# Patient Record
Sex: Male | Born: 1960 | Race: White | Hispanic: No | Marital: Married | Smoking: Never smoker
Health system: Southern US, Community
[De-identification: ages and names within clinical notes are randomized; demographics above are authoritative.]

## PROBLEM LIST (undated history)

## (undated) DIAGNOSIS — R0609 Other forms of dyspnea: Secondary | ICD-10-CM

## (undated) DIAGNOSIS — R002 Palpitations: Principal | ICD-10-CM

## (undated) DIAGNOSIS — I4891 Unspecified atrial fibrillation: Secondary | ICD-10-CM

## (undated) DIAGNOSIS — R7303 Prediabetes: Secondary | ICD-10-CM

## (undated) DIAGNOSIS — E119 Type 2 diabetes mellitus without complications: Secondary | ICD-10-CM

## (undated) DIAGNOSIS — E785 Hyperlipidemia, unspecified: Secondary | ICD-10-CM

## (undated) HISTORY — DX: Palpitations: R00.2

## (undated) HISTORY — DX: Other forms of dyspnea: R06.09

---

## 2015-05-04 ENCOUNTER — Encounter: Payer: Self-pay | Admitting: Emergency Medicine

## 2015-05-04 ENCOUNTER — Emergency Department (INDEPENDENT_AMBULATORY_CARE_PROVIDER_SITE_OTHER)
Admission: EM | Admit: 2015-05-04 | Discharge: 2015-05-04 | Disposition: A | Discharge status: Home / Self Care | Payer: 59 | Source: Home / Self Care

## 2015-05-04 DIAGNOSIS — J069 Acute upper respiratory infection, unspecified: Secondary | ICD-10-CM

## 2015-05-04 MED ORDER — AMOXICILLIN 875 MG PO TABS: ORAL_TABLET | ORAL | Status: DC

## 2015-05-04 NOTE — ED Provider Notes (Signed)
CSN: [id number]     Arrival date & time 05/04/15  [id number] History   None    Chief Complaint  Patient presents with  . URI   HPI URI HISTORY  Daniel CowerLesley is a 54 y.o. male who complains of onset of cold symptoms for 4 days.  Have been using over-the-counter treatment which helps a little bit.  No chills/sweats +  Fever  +  Nasal congestion +  Discolored Post-nasal drainage No sinus pain/pressure No sore throat  +  Cough, nonproductive. Course at times. No wheezing No chest congestion No hemoptysis No shortness of breath No pleuritic pain  No itchy/red eyes No earache  No nausea No vomiting No abdominal pain No diarrhea  No skin rashes +  Fatigue No myalgias No headache   History reviewed. No pertinent past medical history. History reviewed. No pertinent past surgical history. No family history on file. Social History  Substance Use Topics  . Smoking status: Never Smoker   . Smokeless tobacco: None  . Alcohol Use: None    Review of Systems  All other systems reviewed and are negative.   Allergies  Review of patient's allergies indicates no known allergies.  Home Medications   Prior to Admission medications   Medication Sig Start Date End Date Taking? Authorizing Provider  amoxicillin (AMOXIL) 875 MG tablet Take 1 twice a day X 10 days. 05/04/15   Daniel Manesavid Massey, MD   Meds Ordered and Administered this Visit  Medications - No data to display  BP 115/77 mmHg  Pulse 73  Temp(Src) 98.5 F (36.9 C) (Oral)  Ht 6\' 1"  (1.854 m)  Wt 194 lb 4 oz (88.111 kg)  BMI 25.63 kg/m2  SpO2 96% No data found.   Physical Exam  Constitutional: He is oriented to person, place, and time. He appears well-developed and well-nourished. No distress.  HENT:  Head: Normocephalic and atraumatic.  Right Ear: Tympanic membrane, external ear and ear canal normal.  Left Ear: Tympanic membrane, external ear and ear canal normal.  Nose: Mucosal edema and rhinorrhea present. Right  sinus exhibits maxillary sinus tenderness. Left sinus exhibits maxillary sinus tenderness.  Mouth/Throat: Oropharynx is clear and moist. No oral lesions. No oropharyngeal exudate.  Minimal bilateral maxillary sinus tenderness.  Both TMs normal except minimal air-fluid levels. No redness or deformity.  Eyes: Right eye exhibits no discharge. Left eye exhibits no discharge. No scleral icterus.  Neck: Neck supple.  Cardiovascular: Normal rate, regular rhythm and normal heart sounds.   Pulmonary/Chest: Effort normal and breath sounds normal. He has no wheezes. He has no rales.  Lymphadenopathy:    He has cervical adenopathy.  Minimal, shoddy bilateral, minimally tender anterior cervical nodes.  Neurological: He is alert and oriented to person, place, and time.  Skin: Skin is warm and dry.  Nursing note and vitals reviewed.   ED Course  Procedures (including critical care time)  Labs Review Labs Reviewed - No data to display  Imaging Review No results found.    MDM   1. Acute upper respiratory infection    discussed treatment options. I explained this could simply be viral URI, but it's possible that he is starting a secondary bacterial URI. Amoxicillin prescribed, but hold onto this prescription, fill prescription if not improving within 2 days or sooner if worse. Handout for other OTC symptomatic care. Follow-up with your primary care doctor in 7-10 days if not improving, or sooner if symptoms become worse. Precautions discussed. Red flags discussed. Questions invited and answered.  Patient voiced understanding and agreement.     Daniel Manes, MD 05/11/15 [phone]

## 2015-05-04 NOTE — ED Notes (Signed)
Pt c/o head congestion, body aches, bilateral ear discomfort, headache.

## 2015-08-07 ENCOUNTER — Encounter: Payer: Self-pay | Admitting: Emergency Medicine

## 2015-08-07 ENCOUNTER — Emergency Department (INDEPENDENT_AMBULATORY_CARE_PROVIDER_SITE_OTHER)
Admission: EM | Admit: 2015-08-07 | Discharge: 2015-08-07 | Disposition: A | Discharge status: Home / Self Care | Payer: 59 | Source: Home / Self Care | Attending: Family Medicine | Admitting: Family Medicine

## 2015-08-07 ENCOUNTER — Emergency Department (INDEPENDENT_AMBULATORY_CARE_PROVIDER_SITE_OTHER): Payer: 59

## 2015-08-07 DIAGNOSIS — J209 Acute bronchitis, unspecified: Secondary | ICD-10-CM | POA: Diagnosis not present

## 2015-08-07 DIAGNOSIS — R05 Cough: Secondary | ICD-10-CM | POA: Diagnosis not present

## 2015-08-07 MED ORDER — BENZONATATE 200 MG PO CAPS: 200.0000 mg | ORAL_CAPSULE | Freq: Every day | ORAL | Status: DC

## 2015-08-07 MED ORDER — AZITHROMYCIN 250 MG PO TABS
ORAL_TABLET | ORAL | Status: DC
Start: 2015-08-07 — End: 2017-06-09

## 2015-08-07 NOTE — Discharge Instructions (Signed)
Take plain guaifenesin (1200mg  extended release tabs such as Mucinex) twice daily, with plenty of water, for cough and congestion.  May add Pseudoephedrine (30mg , one or two every 4 to 6 hours) for sinus congestion.  Get adequate rest.   May use Afrin nasal spray (or generic oxymetazoline) twice daily for about 5 days and then discontinue.  Also recommend using saline nasal spray several times daily and saline nasal irrigation (AYR is a common brand).  Use Flonase nasal spray each morning after using Afrin nasal spray and saline nasal irrigation. Try warm salt water gargles for sore throat.  Stop all antihistamines for now, and other non-prescription cough/cold preparations. May take Ibuprofen 200mg , 4 tabs every 8 hours with food for chest/sternum discomfort.   Follow-up with family doctor if not improving about one week.

## 2015-08-07 NOTE — ED Provider Notes (Signed)
CSN: [id number]     Arrival date & time 08/07/15  0804 History   First MD Initiated Contact with Patient 08/07/15 0840     Chief Complaint  Patient presents with  . Cough      HPI Comments: Patient complains of six day history of typical cold-like symptoms developing over several days,  including mild sore throat, sinus congestion, headache, fatigue, myalgias, chills, and cough.  He reports that his ears feel clogged.  The history is provided by the patient.    History reviewed. No pertinent past medical history. History reviewed. No pertinent past surgical history. History reviewed. No pertinent family history. Social History  Substance Use Topics  . Smoking status: Never Smoker   . Smokeless tobacco: None  . Alcohol Use: No    Review of Systems + sore throat + cough ? pleuritic pain right chest No wheezing + nasal congestion + post-nasal drainage No sinus pain/pressure No itchy/red eyes ? earache No hemoptysis No SOB No fever, + chills No nausea No vomiting No abdominal pain No diarrhea No urinary symptoms No skin rash + fatigue + myalgias + headache Used OTC meds without relief  Allergies  Review of patient's allergies indicates no known allergies.  Home Medications   Prior to Admission medications   Medication Sig Start Date End Date Taking? Authorizing Provider  azithromycin (ZITHROMAX Z-PAK) 250 MG tablet Take 2 tabs today; then begin one tab once daily for 4 more days. 08/07/15   Lattie Haw, MD  benzonatate (TESSALON) 200 MG capsule Take 1 capsule (200 mg total) by mouth at bedtime. Take as needed for cough 08/07/15   Lattie Haw, MD   Meds Ordered and Administered this Visit  Medications - No data to display  BP 110/73 mmHg  Pulse 89  Temp(Src) 98.2 F (36.8 C) (Oral)  Ht  (1.854 m)  Wt 195 lb (88.451 kg)  BMI 25.73 kg/m2  SpO2 96% No data found.   Physical Exam Nursing notes and Vital Signs reviewed. Appearance:  Patient  appears stated age, and in no acute distress Eyes:  Pupils are equal, round, and reactive to light and accomodation.  Extraocular movement is intact.  Conjunctivae are not inflamed  Ears:  Canals normal.  Right tympanic membrane normal.  Left tympanic membrane is bulging without erythema. Nose:  Congested turbinates.  No sinus tenderness.    Pharynx:  Normal Neck:  Supple.  Tender enlarged posterior/lateral nodes are palpated bilaterally  Lungs:  Clear to auscultation.  Breath sounds are equal.  Moving air well. Heart:  Regular rate and rhythm without murmurs, rubs, or gallops.  Abdomen:  Nontender without masses or hepatosplenomegaly.  Bowel sounds are present.  No CVA or flank tenderness.  Extremities:  No edema.  Skin:  No rash present.   ED Course  Procedures  None  Imaging Review Dg Chest 2 View  08/07/2015  CLINICAL DATA:  URI symptoms with worsening cough for the 6 days, onset of pill fever and chills an 80 feeling today EXAM: CHEST  2 VIEW COMPARISON:  Report of a chest x-ray of December 12, 2012 FINDINGS: The lungs are adequately inflated. There is no focal infiltrate. The interstitial markings are coarse. The heart and pulmonary vascularity are normal. The mediastinum is normal in width. The trachea is midline. The bony thorax exhibits no acute abnormality. IMPRESSION: There is no alveolar pneumonia. Coarse interstitial lung markings may reflect acute or chronic bronchitic change. Electronically Signed   By: Onalee Hua  [place] M.D.   On: 08/07/2015 09:07   Tympanogram:  Normal both ears    MDM   1. Acute bronchitis, unspecified organism    Begin Z-pak for atypical coverage.  Prescription written for Benzonatate [facility](Tessalon) to take at bedtime for night-time cough.  Take plain guaifenesin (1200mg  extended release tabs such as Mucinex) twice daily, with plenty of water, for cough and congestion.  May add Pseudoephedrine (30mg , one or two every 4 to 6 hours) for sinus congestion.  Get adequate  rest.   May use Afrin nasal spray (or generic oxymetazoline) twice daily for about 5 days and then discontinue.  Also recommend using saline nasal spray several times daily and saline nasal irrigation (AYR is a common brand).  Use Flonase nasal spray each morning after using Afrin nasal spray and saline nasal irrigation. Try warm salt water gargles for sore throat.  Stop all antihistamines for now, and other non-prescription cough/cold preparations. May take Ibuprofen 200mg , 4 tabs every 8 hours with food for chest/sternum discomfort.   Follow-up with family doctor if not improving about one week.    Lattie HawStephen A Beese, MD 08/11/15 [phone]

## 2015-08-07 NOTE — ED Notes (Signed)
Cough, congestion, green mucus, body aches, headache, right chest aches x 6 days

## 2017-06-09 ENCOUNTER — Other Ambulatory Visit: Payer: Self-pay

## 2017-06-09 ENCOUNTER — Encounter: Payer: Self-pay | Admitting: Emergency Medicine

## 2017-06-09 ENCOUNTER — Emergency Department
Admission: EM | Admit: 2017-06-09 | Discharge: 2017-06-09 | Disposition: A | Discharge status: Home / Self Care | Payer: 59 | Source: Home / Self Care | Attending: Family Medicine | Admitting: Family Medicine

## 2017-06-09 DIAGNOSIS — J069 Acute upper respiratory infection, unspecified: Secondary | ICD-10-CM | POA: Diagnosis not present

## 2017-06-09 DIAGNOSIS — B9789 Other viral agents as the cause of diseases classified elsewhere: Secondary | ICD-10-CM

## 2017-06-09 HISTORY — DX: Hyperlipidemia, unspecified: E78.5

## 2017-06-09 HISTORY — DX: Prediabetes: R73.03

## 2017-06-09 MED ORDER — AZITHROMYCIN 250 MG PO TABS: ORAL_TABLET | ORAL | 0 refills | Status: DC

## 2017-06-09 NOTE — Discharge Instructions (Addendum)
Take plain guaifenesin (1200mg  extended release tabs such as Mucinex) twice daily, with plenty of water, for cough and congestion.  May add Pseudoephedrine (30mg , one or two every 4 to 6 hours) for sinus congestion.  Get adequate rest.   May use Afrin nasal spray (or generic oxymetazoline) each morning for about 5 days and then discontinue.  Also recommend using saline nasal spray several times daily and saline nasal irrigation (AYR is a common brand).  Use Flonase nasal spray each morning after using Afrin nasal spray and saline nasal irrigation. Try warm salt water gargles for sore throat.  May take Delsym Cough Suppressant at bedtime for nighttime cough.  Stop all antihistamines for now, and other non-prescription cough/cold preparations. May take Ibuprofen 200mg , 4 tabs every 8 hours with food for chest/sternum discomfort.

## 2017-06-09 NOTE — ED Provider Notes (Signed)
[facility]    CSN: [id number] Arrival date & time: 06/09/17  1752     History   Chief Complaint Chief Complaint  Patient presents with  . Cough  . Nasal Congestion  . Generalized Body Aches  . Chills    HPI Daniel Roberts is a 57 y.o. male.   Patient complains of five day history of typical cold-like symptoms developing over several days, including mild sore throat, sinus congestion, headache, fatigue, chills, and cough.  He states that [place] will [place] to [place] next [place].   The history is provided by the patient.    Past Medical History:  Diagnosis Date  . Hyperlipidemia   . Pre-diabetes     There are no active problems to display for this patient.   History reviewed. No pertinent surgical history.     Home Medications    Prior to Admission medications   Medication Sig Start Date End Date Taking? Authorizing Provider  aspirin 81 MG chewable tablet Chew by mouth daily.   Yes [provider]  rosuvastatin (CRESTOR) 20 MG tablet Take 20 mg by mouth daily.   Yes [provider]  azithromycin (ZITHROMAX Z-PAK) 250 MG tablet Take 2 tabs today; then begin one tab once daily for 4 more days. 06/09/17   Lattie HawBeese, Stephen A, MD    Family History History reviewed. No pertinent family history.  Social History Social History   Tobacco Use  . Smoking status: Never Smoker  . Smokeless tobacco: Never Used  Substance Use Topics  . Alcohol use: No  . Drug use: Not on file     Allergies   Bee venom   Review of Systems Review of Systems + sore throat + cough No pleuritic pain No wheezing + nasal congestion + post-nasal drainage No sinus pain/pressure No itchy/red eyes No earache No hemoptysis No SOB No fever, + chills No nausea No vomiting No abdominal pain No diarrhea No urinary symptoms No skin rash + fatigue + myalgias + headache Used OTC meds without relief   Physical Exam Triage Vital Signs ED Triage  Vitals [06/09/17 1827]  Enc Vitals Group     BP 119/71     Pulse Rate 60     Resp 16     Temp 98.7 F (37.1 C)     Temp Source Oral     SpO2 98 %     Weight 195 lb (88.5 kg)     Height 6\' 1"  (1.854 m)     Head Circumference      Peak Flow      Pain Score      Pain Loc      Pain Edu?      Excl. in GC?    No data found.  Updated Vital Signs BP 119/71 (BP Location: Right Arm)   Pulse 60   Temp 98.7 F (37.1 C) (Oral)   Resp 16   Ht 6\' 1"  (1.854 m)   Wt 195 lb (88.5 kg)   SpO2 98%   BMI 25.73 kg/m   Visual Acuity Right Eye Distance:   Left Eye Distance:   Bilateral Distance:    Right Eye Near:   Left Eye Near:    Bilateral Near:     Physical Exam Nursing notes and Vital Signs reviewed. Appearance:  Patient appears stated age, and in no acute distress Eyes:  Pupils are equal, round, and reactive to light and accomodation.  Extraocular movement is intact.  Conjunctivae are  not inflamed  Ears:  Canals normal.  Tympanic membranes normal.  Nose:  Mildly congested turbinates.  No sinus tenderness.  Pharynx:  Normal Neck:  Supple.  Enlarged posterior/lateral nodes are palpated bilaterally, tender to palpation on the left.   Lungs:  Clear to auscultation.  Breath sounds are equal.  Moving air well. Heart:  Regular rate and rhythm without murmurs, rubs, or gallops.  Abdomen:  Nontender without masses or hepatosplenomegaly.  Bowel sounds are present.  No CVA or flank tenderness.  Extremities:  No edema.  Skin:  No rash present.    UC Treatments / Results  Labs (all labs ordered are listed, but only abnormal results are displayed) Labs Reviewed - No data to display  EKG  EKG Interpretation None       Radiology No results found.  Procedures Procedures (including critical care time)  Medications Ordered in UC Medications - No data to display   Initial Impression / Assessment and Plan / UC Course  I have reviewed the triage vital signs and the nursing  notes.  Pertinent labs & imaging results that were available during my care of the patient were reviewed by me and considered in my medical decision making (see chart for details).    Begin Z-pak for atypical coverage. Take plain guaifenesin (1200mg  extended release tabs such as Mucinex) twice daily, with plenty of water, for cough and congestion.  May add Pseudoephedrine (30mg , one or two every 4 to 6 hours) for sinus congestion.  Get adequate rest.   May use Afrin nasal spray (or generic oxymetazoline) each morning for about 5 days and then discontinue.  Also recommend using saline nasal spray several times daily and saline nasal irrigation (AYR is a common brand).  Use Flonase nasal spray each morning after using Afrin nasal spray and saline nasal irrigation. Try warm salt water gargles for sore throat.  May take Delsym Cough Suppressant at bedtime for nighttime cough.  Stop all antihistamines for now, and other non-prescription cough/cold preparations. May take Ibuprofen 200mg , 4 tabs every 8 hours with food for chest/sternum discomfort. Followup with Family Doctor if not improved in 7 to 10 days.    Final Clinical Impressions(s) / UC Diagnoses   Final diagnoses:  Viral URI with cough    ED Discharge Orders        Ordered    azithromycin (ZITHROMAX Z-PAK) 250 MG tablet  Status:  Discontinued     06/09/17 1931    azithromycin (ZITHROMAX Z-PAK) 250 MG tablet     06/09/17 1935          Lattie Haw, MD 06/12/17 2212

## 2017-06-09 NOTE — ED Triage Notes (Signed)
Reports 5 days of congestion, cough which is moving down lower into chest, raw throat, aches, chills. No documented fever. No OTCs today.

## 2018-02-25 ENCOUNTER — Emergency Department
Admission: EM | Admit: 2018-02-25 | Discharge: 2018-02-25 | Disposition: A | Discharge status: Home / Self Care | Payer: 59 | Source: Home / Self Care

## 2018-02-25 ENCOUNTER — Other Ambulatory Visit: Payer: Self-pay

## 2018-02-25 MED ORDER — INFLUENZA VAC SPLIT QUAD 0.5 ML IM SUSY
0.5000 mL | PREFILLED_SYRINGE | Freq: Once | INTRAMUSCULAR | Status: AC
  Administered 2018-02-25: 0.5 mL via INTRAMUSCULAR

## 2018-02-25 NOTE — ED Triage Notes (Signed)
Here for flu shot only

## 2018-07-01 ENCOUNTER — Encounter: Payer: Self-pay | Admitting: Emergency Medicine

## 2018-07-01 ENCOUNTER — Emergency Department
Admission: EM | Admit: 2018-07-01 | Discharge: 2018-07-01 | Disposition: A | Discharge status: Home / Self Care | Payer: 59 | Source: Home / Self Care

## 2018-07-01 ENCOUNTER — Other Ambulatory Visit: Payer: Self-pay

## 2018-07-01 DIAGNOSIS — R9431 Abnormal electrocardiogram [ECG] [EKG]: Secondary | ICD-10-CM

## 2018-07-01 DIAGNOSIS — R031 Nonspecific low blood-pressure reading: Secondary | ICD-10-CM

## 2018-07-01 DIAGNOSIS — I4510 Unspecified right bundle-branch block: Secondary | ICD-10-CM

## 2018-07-01 DIAGNOSIS — R002 Palpitations: Secondary | ICD-10-CM | POA: Diagnosis not present

## 2018-07-01 NOTE — Discharge Instructions (Signed)
°  You have declined EMS transport.  It is recommended that you have your wife drive you to the emergency department for further evaluation and treatment of your palpations (heart racing/skipping beats sensation)

## 2018-07-01 NOTE — ED Triage Notes (Signed)
Patient reports sensation of racing heartbeat at intervals with some irregular beats ; feels this in his left neck area. Denies chest pain and shortness of breath. Did have normal breakfast with 3 cups coffee; regular lunch.

## 2018-07-01 NOTE — ED Provider Notes (Signed)
[facility]    CSN: [id number] Arrival date & time: 07/01/18  1552     History   Chief Complaint Chief Complaint  Patient presents with  . Tachycardia    HPI Daniel Roberts is a 58 y.o. male.   HPI  Daniel Roberts is a 58 y.o. male presenting to UC with c/o sensation of heart racing with intervals of irregular beats.  Sensation radiates into the Left side of his neck.  Denies chest pain or SOB. He did have breakfast and 3 cups of coffee instead of his usual 2 cups.  Sensation started about 2 hours PTA.  Denies nausea or diaphoresis.  No prior hx of heart problems but he did have a prior EKG and stress test that was "normal."     Past Medical History:  Diagnosis Date  . Hyperlipidemia   . Pre-diabetes     There are no active problems to display for this patient.   History reviewed. No pertinent surgical history.     Home Medications    Prior to Admission medications   Medication Sig Start Date End Date Taking? Authorizing Provider  aspirin 81 MG chewable tablet Chew by mouth daily.    [provider]  azithromycin (ZITHROMAX Z-PAK) 250 MG tablet Take 2 tabs today; then begin one tab once daily for 4 more days. 06/09/17   Lattie Haw, MD  rosuvastatin (CRESTOR) 20 MG tablet Take 20 mg by mouth daily.    [provider]    Family History No family history on file.  Social History Social History   Tobacco Use  . Smoking status: Never Smoker  . Smokeless tobacco: Never Used  Substance Use Topics  . Alcohol use: No  . Drug use: Not on file     Allergies   Bee venom   Review of Systems Review of Systems  Constitutional: Negative for chills, diaphoresis, fatigue and fever.  Respiratory: Negative for chest tightness and shortness of breath.   Cardiovascular: Positive for palpitations. Negative for chest pain.  Gastrointestinal: Negative for nausea and vomiting.  Neurological: Negative for dizziness, light-headedness and  headaches.     Physical Exam Triage Vital Signs ED Triage Vitals [07/01/18 1643]  Enc Vitals Group     BP 91/62     Pulse Rate 96     Resp 16     Temp 98.2 F (36.8 C)     Temp Source Oral     SpO2 96 %     Weight      Height      Head Circumference      Peak Flow      Pain Score      Pain Loc      Pain Edu?      Excl. in GC?    Orthostatic VS for the past 24 hrs:  BP- Lying Pulse- Lying BP- Sitting Pulse- Sitting BP- Standing at 0 minutes Pulse- Standing at 0 minutes  07/01/18 1656 109/72 85 97/65 96 105/67 103    Updated Vital Signs BP 91/62 (BP Location: Right Arm) Comment: taken twice  Pulse 96   Temp 98.2 F (36.8 C) (Oral)   Resp 16   Ht 6\' 1"  (1.854 m)   Wt 195 lb (88.5 kg)   SpO2 96%   BMI 25.73 kg/m   Visual Acuity Right Eye Distance:   Left Eye Distance:   Bilateral Distance:    Right Eye Near:   Left Eye Near:  Bilateral Near:     Physical Exam Vitals signs and nursing note reviewed.  Constitutional:      Appearance: Normal appearance. He is well-developed. He is not diaphoretic.  HENT:     Head: Normocephalic and atraumatic.     Nose: Nose normal.     Mouth/Throat:     Mouth: Mucous membranes are moist.  Neck:     Musculoskeletal: Normal range of motion.  Cardiovascular:     Rate and Rhythm: Normal rate and regular rhythm.  Pulmonary:     Effort: Pulmonary effort is normal. No respiratory distress.     Breath sounds: Normal breath sounds. No stridor. No wheezing or rhonchi.  Musculoskeletal: Normal range of motion.  Skin:    General: Skin is warm and dry.  Neurological:     Mental Status: He is alert and oriented to person, place, and time.  Psychiatric:        Behavior: Behavior normal.      UC Treatments / Results  Labs (all labs ordered are listed, but only abnormal results are displayed) Labs Reviewed - No data to display  EKG Date/Time: 07/01/2018   16:31:26 Ventricular Rate: 84 PR Interval: 180 QRS Duration:  104 QT Interval: 366 QTC Calculation: 432 P-R-T axes: 50   -8   43 Text Interpretation: Normal sinus rhythm, possible Left atrial enlargement. Incomplete right bundle branch block. Possible inferior infarct, age undetermined. Abnormal EKG No prior available to compare.   Radiology No results found.  Procedures Procedures (including critical care time)  Medications Ordered in UC Medications - No data to display  Initial Impression / Assessment and Plan / UC Course  I have reviewed the triage vital signs and the nursing notes.  Pertinent labs & imaging results that were available during my care of the patient were reviewed by me and considered in my medical decision making (see chart for details).     Pt's BP on low end for pt at 91/62   Pt usually in 120s/70s EKG abnormal w/o prior to compare Recommend pt have further evaluation in emergency department Wife is accompanying pt and feels comfortable driving pt to the emergency department. They declined EMS transport Pt safe for discharge.   Final Clinical Impressions(s) / UC Diagnoses   Final diagnoses:  Palpitations  Low blood pressure reading  Abnormal EKG  Right bundle branch block     Discharge Instructions      You have declined EMS transport.  It is recommended that you have your wife drive you to the emergency department for further evaluation and treatment of your palpations (heart racing/skipping beats sensation)     ED Prescriptions    None     Controlled Substance Prescriptions [place] Controlled Substance Registry consulted? Not Applicable   Rolla Plate 07/01/18 1733

## 2018-07-04 ENCOUNTER — Telehealth: Payer: Self-pay | Admitting: Emergency Medicine

## 2018-07-04 NOTE — Telephone Encounter (Signed)
Message left on voice mail inquiring about patient's status and encouraging patient to call with questions/concerns.  

## 2018-07-16 NOTE — Progress Notes (Signed)
Subjective:  Primary Physician:  Deland Pretty, MD  Patient ID: Daniel Roberts, male    DOB: 1960/09/13, 58 y.o.   MRN: [id number]  Chief Complaint  Patient presents with  . Palpitations    HPI: Daniel Roberts  is a 58 y.o. male  with Patient was seen in the emergency room on 07/01/2018 when he presented with palpitations and not feeling well through the whole day, and discharged home and advised to follow-up with [place].  Was also found to have low blood pressure on evaluation.  Patient admits to having had 3 cups of coffee several hours prior to presentation.  EKG, labs including CBC, CMP, TSH Normal except for nonfasting elevated blood sugar of 120.  Chest x-ray was normal also.  Again on 07/13/2018, he had similar episode of palpitations but did not go to the emergency room.  No other associated symptoms, no chest pain, no shortness breath, no dizziness or syncope. Again he has had about 2-3 cups of coffee/caffeinated drinks in the day which is fairly normal for him, but the episode happened in the evening. His past medical history significant for new onset diabetes mellitus, planning on starting metformin soon, also has hyperlipidemia but has not been tolerant to statins in the past  Including Livalo leading to severe rash.  Patient has had a routine treadmill exercise stress test in our office in December 2018 for dyspnea.   Past Medical History:  Diagnosis Date  . Dyspnea on exertion 07/18/2018  . Hyperlipidemia   . Palpitations 07/18/2018  . Pre-diabetes     History reviewed. No pertinent surgical history.  Social History   Socioeconomic History  . Marital status: Married    Spouse name: Not on file  . Number of children: 3  . Years of education: Not on file  . Highest education level: Not on file  Occupational History  . Not on file  Social Needs  . Financial resource strain: Not on file  . Food insecurity:    Worry: Not on file    Inability: Not on file  .  Transportation needs:    Medical: Not on file    Non-medical: Not on file  Tobacco Use  . Smoking status: Never Smoker  . Smokeless tobacco: Never Used  Substance and Sexual Activity  . Alcohol use: No  . Drug use: Never  . Sexual activity: Not on file  Lifestyle  . Physical activity:    Days per week: Not on file    Minutes per session: Not on file  . Stress: Not on file  Relationships  . Social connections:    Talks on phone: Not on file    Gets together: Not on file    Attends religious service: Not on file    Active member of club or organization: Not on file    Attends meetings of clubs or organizations: Not on file    Relationship status: Not on file  . Intimate partner violence:    Fear of current or ex partner: Not on file    Emotionally abused: Not on file    Physically abused: Not on file    Forced sexual activity: Not on file  Other Topics Concern  . Not on file  Social History Narrative  . Not on file    Current Outpatient Medications on File Prior to Visit  Medication Sig Dispense Refill  . aspirin 81 MG chewable tablet Chew by mouth daily.    . Cinnamon 500 MG  capsule Take by mouth daily. Unsure of dose    . Multiple Vitamins-Minerals (CENTRUM ADULTS PO) Take by mouth daily as needed.    . Omega-3 Fatty Acids (FISH OIL) 1000 MG CAPS Take by mouth 2 (two) times daily.    Marland Kitchen azithromycin (ZITHROMAX Z-PAK) 250 MG tablet Take 2 tabs today; then begin one tab once daily for 4 more days. (Patient not taking: Reported on 07/18/2018) 6 tablet 0  . rosuvastatin (CRESTOR) 20 MG tablet Take 20 mg by mouth daily.     No current facility-administered medications on file prior to visit.     Review of Systems  Constitutional: Negative for malaise/fatigue and weight loss.  Respiratory: Positive for shortness of breath. Negative for cough and hemoptysis.   Cardiovascular: Positive for palpitations. Negative for chest pain, claudication and leg swelling.  Gastrointestinal:  Negative for abdominal pain, blood in stool, constipation, heartburn and vomiting.  Genitourinary: Negative for dysuria.  Musculoskeletal: Negative for joint pain and myalgias.  Neurological: Negative for dizziness, focal weakness and headaches.  Endo/Heme/Allergies: Does not bruise/bleed easily.  Psychiatric/Behavioral: Negative for depression. The patient is not nervous/anxious.   All other systems reviewed and are negative.      Objective:  Blood pressure 112/71, pulse 64, height '6\' 1"'  (1.854 m), weight 202 lb 9.6 oz (91.9 kg), SpO2 95 %. Body mass index is 26.73 kg/m.  Physical Exam  Constitutional: He appears well-developed and well-nourished. No distress.  HENT:  Head: Atraumatic.  Eyes: Conjunctivae are normal.  Neck: Neck supple. No JVD present. No thyromegaly present.  Cardiovascular: Normal rate, regular rhythm, normal heart sounds and intact distal pulses. Exam reveals no gallop.  No murmur heard. Pulmonary/Chest: Effort normal and breath sounds normal.  Abdominal: Soft. Bowel sounds are normal.  Musculoskeletal: Normal range of motion.        General: No edema.  Neurological: He is alert.  Skin: Skin is warm and dry.  Psychiatric: He has a normal mood and affect.    CARDIAC STUDIES:   Exercise Treadmill Stress Test 04/28/2017: Indication: Dyspnea in a diabetic patient  The patient exercised on Bruce protocol for 13:43 min. Patient achieved 9.06 METS and reached HR 146 bpm, which is 89 % of maximum age-predicted HR.  Stress test terminated due to met target heart rate. Exercise capacity was normal.  HR Response to Exercise: Appropriate  BP Response to Exercise: Normal resting BP- appropriate response Chest Pain: none. Arrhythmias: none ST Changes: With peak exercise there was no ST-T changes of ischemia.  Overall Impression: Normal stress test. Continue primary/secondary prevention.  Assessment & Recommendations:   1. Palpitations  EKG  07/18/2018: Normal sinus rhythm at rate of 71 bpm, normal axis, incomplete right bundle branch block.  No evidence of ischemia, otherwise normal EKG.  2. Dyspnea on exertion   Recommendation:   Patient's symptoms of palpitations may be related to PACs and PVCs.  I do not suspect atrial fibrillation.  At this point watchful waiting is indicated, I have not recommended an event monitor for now.  He will try to avoid drinking excessive coffee and also reduce his caffeine intake.  His symptoms may be related to the extreme stress that he has.   Also he is having some disturbed sleep at night especially keeping himself asleep for greater than 4 hours.  I'll obtain an echocardiogram to exclude the reasons for dyspnea, he had Mild dyspnea on exertion especially climbing flights of stairs.However he just returned in January from the [place] and  he was exercising on a daily basis without any limitations.  I do not think he needs a repeat stress testing.  Watchful waiting is indicated.   He has had severe statin intolerance, Lipitor caused myalgias, Crestor and Livalo caused severe rash.  I'll try simvastatin 20 mg along with Zetia, duration and obtain CMP along with lipid profile testing in 6 weeks and see him back in 2 months for follow-up of echocardiogram and the labs. Now diagnosed with new onset diabetes mellitus,  He thinks he may be starting metformin soon.  Adrian Prows, MD, [facility] 07/18/2018, 2:23 PM [facility]. [place] Pager: [phone] Office: [phone] If no answer Cell [phone]

## 2018-07-18 ENCOUNTER — Ambulatory Visit: Payer: 59 | Admitting: Cardiology

## 2018-07-18 ENCOUNTER — Other Ambulatory Visit: Payer: Self-pay

## 2018-07-18 ENCOUNTER — Encounter: Payer: Self-pay | Admitting: Cardiology

## 2018-07-18 VITALS — BP 112/71 | HR 64 | Ht 73.0 in | Wt 202.6 lb

## 2018-07-18 DIAGNOSIS — E119 Type 2 diabetes mellitus without complications: Secondary | ICD-10-CM

## 2018-07-18 DIAGNOSIS — R002 Palpitations: Secondary | ICD-10-CM | POA: Diagnosis not present

## 2018-07-18 DIAGNOSIS — R0609 Other forms of dyspnea: Secondary | ICD-10-CM | POA: Diagnosis not present

## 2018-07-18 DIAGNOSIS — E7800 Pure hypercholesterolemia, unspecified: Secondary | ICD-10-CM

## 2018-07-18 DIAGNOSIS — R0600 Dyspnea, unspecified: Secondary | ICD-10-CM

## 2018-07-18 HISTORY — DX: Dyspnea, unspecified: R06.00

## 2018-07-18 HISTORY — DX: Other forms of dyspnea: R06.09

## 2018-07-18 HISTORY — DX: Palpitations: R00.2

## 2018-07-18 MED ORDER — EZETIMIBE 10 MG PO TABS: 10.0000 mg | ORAL_TABLET | Freq: Every day | ORAL | 2 refills | Status: DC

## 2018-07-18 MED ORDER — SIMVASTATIN 20 MG PO TABS: 20.0000 mg | ORAL_TABLET | Freq: Every day | ORAL | 2 refills | Status: DC

## 2018-07-27 ENCOUNTER — Other Ambulatory Visit: Payer: 59

## 2018-08-07 ENCOUNTER — Other Ambulatory Visit: Payer: 59

## 2018-09-19 ENCOUNTER — Ambulatory Visit: Payer: 59 | Admitting: Cardiology

## 2019-04-03 ENCOUNTER — Other Ambulatory Visit: Payer: Self-pay

## 2019-04-03 ENCOUNTER — Ambulatory Visit (INDEPENDENT_AMBULATORY_CARE_PROVIDER_SITE_OTHER): Payer: 59

## 2019-04-03 DIAGNOSIS — R0609 Other forms of dyspnea: Secondary | ICD-10-CM

## 2019-04-03 DIAGNOSIS — R002 Palpitations: Secondary | ICD-10-CM

## 2019-04-03 DIAGNOSIS — R0600 Dyspnea, unspecified: Secondary | ICD-10-CM

## 2019-04-08 NOTE — Progress Notes (Signed)
LVM with details.

## 2019-05-23 ENCOUNTER — Other Ambulatory Visit: Payer: Self-pay

## 2019-05-23 ENCOUNTER — Encounter: Payer: Self-pay | Admitting: Cardiology

## 2019-05-23 ENCOUNTER — Ambulatory Visit: Payer: Self-pay | Admitting: Cardiology

## 2019-05-23 VITALS — BP 127/78 | HR 57 | Ht 73.0 in | Wt 197.0 lb

## 2019-05-23 DIAGNOSIS — E7800 Pure hypercholesterolemia, unspecified: Secondary | ICD-10-CM

## 2019-05-23 DIAGNOSIS — I351 Nonrheumatic aortic (valve) insufficiency: Secondary | ICD-10-CM

## 2019-05-23 DIAGNOSIS — R002 Palpitations: Secondary | ICD-10-CM | POA: Diagnosis not present

## 2019-05-23 NOTE — Progress Notes (Signed)
Primary Physician/Referring:  Deland Pretty, MD  Patient ID: Lynnwood Beckford, male    DOB: [birth year], 59 y.o.   MRN: [id number]  Chief Complaint  Patient presents with  . Palpitations   HPI:    Dyllan Hughett  is a 59 y.o. Male patient last seen by me almost a year ago when he initially presented on 07/01/2018 with palpitations to the emergency room, states that he had drank 3 cups of coffee prior to his ED visit. His past medical history significant for new onset diet controlled diabetes mellitus, planning on restarting metformin soon, also has hyperlipidemia but has not been tolerant to statins in the past  Including Livalo leading to severe rash. He is now on Nexlitol.  Still has occasional palpitations. No chest pain or dyspnea and is active.   Past Medical History:  Diagnosis Date  . Dyspnea on exertion 07/18/2018  . Hyperlipidemia   . Palpitations 07/18/2018  . Pre-diabetes    History reviewed. No pertinent surgical history. Social History   Tobacco Use  . Smoking status: Never Smoker  . Smokeless tobacco: Never Used  Substance Use Topics  . Alcohol use: No    ROS  Review of Systems  Constitution: Negative for weight gain.  Cardiovascular: Positive for palpitations. Negative for dyspnea on exertion, leg swelling and syncope.  Respiratory: Negative for hemoptysis.   Endocrine: Negative for cold intolerance.  Hematologic/Lymphatic: Does not bruise/bleed easily.  Gastrointestinal: Negative for hematochezia and melena.  Neurological: Negative for headaches and light-headedness.   Objective  Blood pressure 127/78, pulse (!) 57, height '6\' 1"'  (1.854 m), weight 197 lb (89.4 kg), SpO2 98 %.  Vitals with BMI 05/23/2019 07/18/2018 07/01/2018  Height '6\' 1"'  '6\' 1"'  '6\' 1"'   Weight 197 lbs 202 lbs 10 oz 195 lbs  BMI 26 52.84 13.24  Systolic 401 027 91  Diastolic 78 71 62  Pulse 57 64 96     Physical Exam  Constitutional: He appears well-developed and well-nourished.  Neck: No  thyromegaly present.  Cardiovascular: Normal rate, regular rhythm, normal heart sounds and intact distal pulses. Exam reveals no gallop.  No murmur heard. No leg edema, no JVD.  Pulmonary/Chest: Effort normal and breath sounds normal.  Abdominal: Soft. Bowel sounds are normal.  Musculoskeletal:     Cervical back: Neck supple.  Skin: Skin is warm and dry.   Laboratory examination:   No results for input(s): NA, K, CL, CO2, GLUCOSE, BUN, CREATININE, CALCIUM, GFRNONAA, GFRAA in the last 8760 hours. CrCl cannot be calculated (No successful lab value found.).  No flowsheet data found. No flowsheet data found. Lipid Panel  No results found for: CHOL, TRIG, HDL, CHOLHDL, VLDL, LDLCALC, LDLDIRECT HEMOGLOBIN A1C No results found for: HGBA1C, MPG TSH No results for input(s): TSH in the last 8760 hours.  Medications and allergies   Allergies  Allergen Reactions  . Crestor [Rosuvastatin Calcium]     Severe rash  . Lipitor [Atorvastatin Calcium]     Severe myalgia  . Livalo [Pitavastatin]     Severe rash  . Bee Venom     Current Outpatient Medications  Medication Instructions  . aspirin 81 mg, Oral, Daily  . Cinnamon 500 MG capsule Oral, Daily, Unsure of dose   . Multiple Vitamins-Minerals (CENTRUM ADULTS PO) Oral, Daily PRN  . Nexletol 180 mg, Oral, Daily  . Omega-3 Fatty Acids (FISH OIL) 1000 MG CAPS Oral, 2 times daily    Radiology:  No results found.  Cardiac Studies:   Exercise  Treadmill Stress Test 04/28/2017: Indication: Dyspnea in a diabetic patient  The patient exercised on Bruce protocol for 13:43 min. Patient achieved 9.06 METS and reached HR 146 bpm, which is 89 % of maximum age-predicted HR.  Stress test terminated due to met target heart rate. Exercise capacity was normal.  HR Response to Exercise: Appropriate  BP Response to Exercise: Normal resting BP- appropriate response Chest Pain: none. Arrhythmias: none ST Changes: With peak exercise there  was no ST-T changes of ischemia.  Overall Impression: Normal stress test. Continue primary/secondary prevention.  Echocardiogram 04/03/2019: Left ventricle cavity is normal in size and thickness. Normal LV systolic function with visual EF 50-55%. Normal global wall motion. Normal diastolic filling pattern.  Trileaflet aortic valve. Mild (Grade I) aortic regurgitation. Mild (Grade I) mitral regurgitation. IVC is dilated with respiratory variation. Estimated RA pressure 8 mmHg.  Assessment     ICD-10-CM   1. Palpitations  R00.2 EKG 12-Lead  2. Hypercholesteremia  E78.00   3. Mild aortic regurgitation  I35.1     EKG 05/23/2019: Normal sinus rhythm/sinus bradycardia at rate of 53 bpm, normal axis.  Incomplete right bundle branch block.  No evidence of ischemia, normal EKG. No significant change from  EKG 07/18/2018.   No orders of the defined types were placed in this encounter.   Medications Discontinued During This Encounter  Medication Reason  . ezetimibe (ZETIA) 10 MG tablet Error  . simvastatin (ZOCOR) 20 MG tablet Error     Recommendations:   Yaniel Limbaugh  is a  59 y.o. Male patient last seen by me almost a year ago when he initially presented on 07/01/2018 with palpitations to the emergency room, states that he had drank 3 cups of coffee prior to his ED visit. His past medical history significant for new onset diet controlled diabetes mellitus, planning on restarting metformin soon, also has hyperlipidemia but has not been tolerant to statins in the past  Including Livalo leading to severe rash. He is now on Nexlitol.  Is presently doing well, except for occasional palpitation which is this PACs and PVCs no new symptoms.  I reviewed the results of the echocardiogram again with the patient.  He has mild aortic and mitral regurgitation.  No clinical consequence.  Extensive discussion regarding primary prevention was discussed.  He is now appropriately being tried on new  cholesterol-lowering agent.  Regular exercise was also discussed.  Unless he has no symptoms, I'll see him back in 3 years for follow-up of valvular heart disease.  Adrian Prows, MD, [facility] 05/23/2019, 11:10 AM [facility]. [place] Office: [phone]

## 2020-05-15 ENCOUNTER — Emergency Department: Admit: 2020-05-15 | Discharge status: Absent without leave | Payer: Self-pay

## 2020-05-16 ENCOUNTER — Emergency Department
Admission: RE | Admit: 2020-05-16 | Discharge: 2020-05-16 | Disposition: A | Discharge status: Home / Self Care | Payer: 59 | Source: Ambulatory Visit

## 2020-05-16 ENCOUNTER — Other Ambulatory Visit: Payer: Self-pay

## 2020-05-16 VITALS — BP 153/83 | HR 71 | Temp 98.4°F | Resp 15 | Ht 73.0 in | Wt 195.0 lb

## 2020-05-16 DIAGNOSIS — J988 Other specified respiratory disorders: Secondary | ICD-10-CM

## 2020-05-16 DIAGNOSIS — J3489 Other specified disorders of nose and nasal sinuses: Secondary | ICD-10-CM

## 2020-05-16 DIAGNOSIS — B9789 Other viral agents as the cause of diseases classified elsewhere: Secondary | ICD-10-CM | POA: Diagnosis not present

## 2020-05-16 DIAGNOSIS — Z9189 Other specified personal risk factors, not elsewhere classified: Secondary | ICD-10-CM

## 2020-05-16 MED ORDER — AMOXICILLIN-POT CLAVULANATE 875-125 MG PO TABS: 1.0000 | ORAL_TABLET | Freq: Two times a day (BID) | ORAL | 0 refills | Status: DC

## 2020-05-16 MED ORDER — IPRATROPIUM BROMIDE 0.06 % NA SOLN: 2.0000 | Freq: Four times a day (QID) | NASAL | 1 refills | Status: DC

## 2020-05-16 NOTE — Discharge Instructions (Signed)
  You may take 500mg acetaminophen every 4-6 hours or in combination with ibuprofen 400-600mg every 6-8 hours as needed for pain, inflammation, and fever.  Be sure to well hydrated with clear liquids and get at least 8 hours of sleep at night, preferably more while sick.   Please follow up with family medicine in 1 week if needed.   

## 2020-05-16 NOTE — ED Triage Notes (Signed)
Pt has congestion since Tuesday w/ sore throat  Flew back from [place] 1 week ago  Moderna vaccine in May 2021 - no booster Had a flu vaccine  Rapid test on Wed was negative  Denies fever or chills

## 2020-05-18 NOTE — ED Provider Notes (Signed)
[facility]    CSN: [id number] Arrival date & time: 05/16/20  1439      History   Chief Complaint Chief Complaint  Patient presents with  . Appointment  . Cough  . Nasal Congestion    HPI Daiquan Resnik is a 60 y.o. male.   HPI  Davinci Glotfelty is a 60 y.o. male presenting to UC with c/o 5 days of worsening sinus pain and pressure, associated sore throat and congestion.  She flew back from [place] 1 week ago.  She has had her COVID vaccine in May 2021 but no booster. She had a negative rapid COVID test the day after symptoms started. Denies fever, chills, n/v/d.  Pt reports hx of sinus infections.    Past Medical History:  Diagnosis Date  . Dyspnea on exertion 07/18/2018  . Hyperlipidemia   . Palpitations 07/18/2018  . Pre-diabetes     Patient Active Problem List   Diagnosis Date Noted  . Palpitations 07/18/2018  . Dyspnea on exertion 07/18/2018    History reviewed. No pertinent surgical history.     Home Medications    Prior to Admission medications   Medication Sig Start Date End Date Taking? Authorizing Provider  amoxicillin-clavulanate (AUGMENTIN) 875-125 MG tablet Take 1 tablet by mouth 2 (two) times daily. One po bid x 7 days 05/16/20  Yes Aftin Lye O, PA-C  ipratropium (ATROVENT) 0.06 % nasal spray Place 2 sprays into both nostrils 4 (four) times daily. 05/16/20  Yes Lurene Shadow, PA-C  sildenafil (VIAGRA) 25 MG tablet 1-2 tablet as needed 11/20/19  Yes [provider]  Ascorbic Acid (VITAMIN C) 500 MG CAPS 1 tablet    [provider]  Bempedoic Acid (NEXLETOL) 180 MG TABS Take 180 mg by mouth daily.    [provider]  Cinnamon 500 MG capsule Take by mouth daily. Unsure of dose    [provider]  Multiple Vitamin (MULTI-VITAMIN) tablet 1 tablet    [provider]  Multiple Vitamins-Minerals (CENTRUM ADULTS PO) Take by mouth daily as needed. Patient not taking: Reported on 05/16/2020    [provider]  Omega-3 Fatty Acids (FISH OIL) 1000 MG CAPS Take by mouth 2 (two) times daily.    [provider]    Family History Family History  Problem Relation Age of Onset  . Diabetes Mother   . Stroke Father   . Diabetes Brother   . Healthy Brother     Social History Social History   Tobacco Use  . Smoking status: Never Smoker  . Smokeless tobacco: Never Used  Vaping Use  . Vaping Use: Never used  Substance Use Topics  . Alcohol use: No  . Drug use: Never     Allergies   Crestor [rosuvastatin calcium], Lipitor [atorvastatin calcium], Livalo [pitavastatin], Bee venom, Ezetimibe, and Metformin   Review of Systems Review of Systems  Constitutional: Negative for chills and fever.  HENT: Positive for congestion, sinus pressure and sore throat. Negative for ear pain, trouble swallowing and voice change.   Respiratory: Positive for cough (mild). Negative for shortness of breath.   Cardiovascular: Negative for chest pain and palpitations.  Gastrointestinal: Negative for abdominal pain, diarrhea, nausea and vomiting.  Musculoskeletal: Negative for arthralgias, back pain and myalgias.  Skin: Negative for rash.  Neurological: Positive for headaches (frontal). Negative for dizziness and light-headedness.  All other systems reviewed and are negative.    Physical Exam Triage Vital Signs ED Triage Vitals  Enc Vitals  Group     BP 05/16/20 1607 (!) 153/83     Pulse Rate 05/16/20 1607 71     Resp 05/16/20 1607 15     Temp 05/16/20 1607 98.4 F (36.9 C)     Temp Source 05/16/20 1607 Oral     SpO2 05/16/20 1607 96 %     Weight 05/16/20 1610 195 lb (88.5 kg)     Height 05/16/20 1610 6\' 1"  (1.854 m)     Head Circumference --      Peak Flow --      Pain Score 05/16/20 1609 3     Pain Loc --      Pain Edu? --      Excl. in GC? --    No data found.  Updated Vital Signs BP (!) 153/83 (BP Location: Right Arm)   Pulse 71   Temp 98.4 F (36.9 C) (Oral)    Resp 15   Ht 6\' 1"  (1.854 m)   Wt 195 lb (88.5 kg)   SpO2 96%   BMI 25.73 kg/m   Visual Acuity Right Eye Distance:   Left Eye Distance:   Bilateral Distance:    Right Eye Near:   Left Eye Near:    Bilateral Near:     Physical Exam Vitals and nursing note reviewed.  Constitutional:      General: He is not in acute distress.    Appearance: Normal appearance. He is well-developed and well-nourished. He is not ill-appearing, toxic-appearing or diaphoretic.  HENT:     Head: Normocephalic and atraumatic.     Right Ear: Tympanic membrane and ear canal normal.     Left Ear: Tympanic membrane and ear canal normal.     Nose: Mucosal edema and congestion present.     Right Sinus: Maxillary sinus tenderness and frontal sinus tenderness present.     Left Sinus: Maxillary sinus tenderness and frontal sinus tenderness present.     Comments: Mild sinus tenderness     Mouth/Throat:     Lips: Pink.     Mouth: Mucous membranes are moist.     Pharynx: Oropharynx is clear. Uvula midline.  Eyes:     Extraocular Movements: EOM normal.  Cardiovascular:     Rate and Rhythm: Normal rate and regular rhythm.  Pulmonary:     Effort: Pulmonary effort is normal. No respiratory distress.     Breath sounds: Normal breath sounds. No stridor. No wheezing, rhonchi or rales.  Musculoskeletal:        General: Normal range of motion.     Cervical back: Normal range of motion and neck supple.  Lymphadenopathy:     Cervical: No cervical adenopathy.  Skin:    General: Skin is warm and dry.  Neurological:     Mental Status: He is alert and oriented to person, place, and time.  Psychiatric:        Mood and Affect: Mood and affect normal.        Behavior: Behavior normal.      UC Treatments / Results  Labs (all labs ordered are listed, but only abnormal results are displayed) Labs Reviewed  COVID-19, FLU A+B NAA    EKG   Radiology No results found.  Procedures Procedures (including critical  care time)  Medications Ordered in UC Medications - No data to display  Initial Impression / Assessment and Plan / UC Course  I have reviewed the triage vital signs and the nursing notes.  Pertinent labs & imaging  results that were available during my care of the patient were reviewed by me and considered in my medical decision making (see chart for details).     Hx and exam c/w viral illness COVID/Flu test pending Encouraged symptomatic tx Prescription to hold for augmentin, pt to start if not improving in a few days of symptomatic tx given current duration of symptoms and hx of sinus infections. AVS given  Final Clinical Impressions(s) / UC Diagnoses   Final diagnoses:  Viral respiratory illness  At increased risk of exposure to COVID-19 virus  Sinus pressure     Discharge Instructions      You may take 500mg  acetaminophen every 4-6 hours or in combination with ibuprofen 400-600mg  every 6-8 hours as needed for pain, inflammation, and fever.  Be sure to well hydrated with clear liquids and get at least 8 hours of sleep at night, preferably more while sick.   Please follow up with family medicine in 1 week if needed.     ED Prescriptions    Medication Sig Dispense Auth. Provider   amoxicillin-clavulanate (AUGMENTIN) 875-125 MG tablet Take 1 tablet by mouth 2 (two) times daily. One po bid x 7 days 14 tablet Eloyce Bultman O, PA-C   ipratropium (ATROVENT) 0.06 % nasal spray Place 2 sprays into both nostrils 4 (four) times daily. 15 mL 12-19-1986, PA-C     PDMP not reviewed this encounter.   Lurene Shadow, Lurene Shadow 05/18/20 [phone]

## 2020-05-19 LAB — COVID-19, FLU A+B NAA
Influenza A, NAA: NOT DETECTED
Influenza B, NAA: NOT DETECTED
SARS-CoV-2, NAA: NOT DETECTED

## 2021-06-05 ENCOUNTER — Encounter: Payer: Self-pay | Admitting: Family Medicine

## 2021-06-05 ENCOUNTER — Other Ambulatory Visit: Payer: Self-pay

## 2021-06-05 ENCOUNTER — Emergency Department
Admission: EM | Admit: 2021-06-05 | Discharge: 2021-06-05 | Disposition: A | Discharge status: Home / Self Care | Payer: 59 | Source: Home / Self Care

## 2021-06-05 DIAGNOSIS — N399 Disorder of urinary system, unspecified: Secondary | ICD-10-CM

## 2021-06-05 LAB — POCT URINALYSIS DIP (MANUAL ENTRY)
Bilirubin, UA: NEGATIVE
Blood, UA: NEGATIVE
Glucose, UA: NEGATIVE mg/dL
Ketones, POC UA: NEGATIVE mg/dL
Leukocytes, UA: NEGATIVE
Nitrite, UA: NEGATIVE
Protein Ur, POC: NEGATIVE mg/dL
Spec Grav, UA: 1.01 (ref 1.010–1.025)
Urobilinogen, UA: 0.2 E.U./dL
pH, UA: 6 (ref 5.0–8.0)

## 2021-06-05 NOTE — Discharge Instructions (Addendum)
Patient UA was completely unremarkable this morning.  Advised/encouraged patient if irregular voiding pattern and mild bilateral flank pain continues please follow-up with either PCP or urology for further evaluation.

## 2021-06-05 NOTE — ED Triage Notes (Signed)
Pt presents to Urgent Care with c/o fatigue and bilateral flank pain x 2 days. Reports intermittent urinary problems x 6 months, foul odor noted recently.

## 2021-06-05 NOTE — ED Provider Notes (Signed)
[facility]    CSN: [id number] Arrival date & time: 06/05/21  1020      History   Chief Complaint Chief Complaint  Patient presents with   Flank Pain   Fatigue    HPI Daniel Roberts is a 61 y.o. male.   HPI 61 year old male presents with fatigue and bilateral flank pain for 2 days.  Reports intermittent urinary problems for 6 months, foul odor noted recently.  Patient denies inability to void, although complains of some urinary retention intermittently.  Past Medical History:  Diagnosis Date   Dyspnea on exertion 07/18/2018   Hyperlipidemia    Palpitations 07/18/2018   Pre-diabetes     Patient Active Problem List   Diagnosis Date Noted   Palpitations 07/18/2018   Dyspnea on exertion 07/18/2018    History reviewed. No pertinent surgical history.     Home Medications    Prior to Admission medications   Medication Sig Start Date End Date Taking? Authorizing Provider  Cetirizine HCl (ZYRTEC ALLERGY) 10 MG CAPS Take by mouth.   Yes [provider]  ibuprofen (ADVIL) 400 MG tablet Take 400 mg by mouth every 6 (six) hours as needed.   Yes [provider]  Plant Sterols and Stanols (CHOLESTOFF) 450 MG TABS Take by mouth.   Yes [provider]  amoxicillin-clavulanate (AUGMENTIN) 875-125 MG tablet Take 1 tablet by mouth 2 (two) times daily. One po bid x 7 days 05/16/20   Lurene Shadow, PA-C  Ascorbic Acid (VITAMIN C) 500 MG CAPS 1 tablet    [provider]  Bempedoic Acid (NEXLETOL) 180 MG TABS Take 180 mg by mouth daily.    [provider]  Cinnamon 500 MG capsule Take by mouth daily. Unsure of dose    [provider]  ipratropium (ATROVENT) 0.06 % nasal spray Place 2 sprays into both nostrils 4 (four) times daily. 05/16/20   Lurene Shadow, PA-C  Multiple Vitamin (MULTI-VITAMIN) tablet 1 tablet    [provider]  Multiple Vitamins-Minerals (CENTRUM ADULTS PO) Take by mouth daily as needed. Patient  not taking: Reported on 05/16/2020    [provider]  Omega-3 Fatty Acids (FISH OIL) 1000 MG CAPS Take by mouth 2 (two) times daily.    [provider]  sildenafil (VIAGRA) 25 MG tablet 1-2 tablet as needed 11/20/19   [provider]    Family History Family History  Problem Relation Age of Onset   Diabetes Mother    Stroke Father    Diabetes Brother    Healthy Brother     Social History Social History   Tobacco Use   Smoking status: Never   Smokeless tobacco: Never  Vaping Use   Vaping Use: Never used  Substance Use Topics   Alcohol use: No   Drug use: Never     Allergies   Crestor [rosuvastatin calcium], Lipitor [atorvastatin calcium], Livalo [pitavastatin], Bee venom, Ezetimibe, and Metformin   Review of Systems Review of Systems  Constitutional:  Positive for fatigue.  Genitourinary:  Positive for flank pain.       Reports intermittent urinary problems for 6 months    Physical Exam Triage Vital Signs ED Triage Vitals  Enc Vitals Group     BP      Pulse      Resp      Temp      Temp src      SpO2      Weight  Height      Head Circumference      Peak Flow      Pain Score      Pain Loc      Pain Edu?      Excl. in GC?    No data found.  Updated Vital Signs BP (!) 152/81 (BP Location: Left Arm)    Pulse 67    Temp 98 F (36.7 C) (Oral)    Resp 20    Ht 6\' 1"  (1.854 m)    Wt 192 lb (87.1 kg)    SpO2 97%    BMI 25.33 kg/m      Physical Exam Vitals and nursing note reviewed.  Constitutional:      Appearance: Normal appearance. He is obese.  HENT:     Mouth/Throat:     Mouth: Mucous membranes are moist.     Pharynx: Oropharynx is clear.  Eyes:     Extraocular Movements: Extraocular movements intact.     Conjunctiva/sclera: Conjunctivae normal.     Pupils: Pupils are equal, round, and reactive to light.  Cardiovascular:     Rate and Rhythm: Normal rate and regular rhythm.     Pulses: Normal pulses.     Heart  sounds: Normal heart sounds.  Pulmonary:     Effort: Pulmonary effort is normal.     Breath sounds: Normal breath sounds.  Abdominal:     Tenderness: There is no right CVA tenderness or left CVA tenderness.  Musculoskeletal:     Cervical back: Normal range of motion and neck supple.  Skin:    General: Skin is warm and dry.  Neurological:     General: No focal deficit present.     Mental Status: He is alert and oriented to person, place, and time.     UC Treatments / Results  Labs (all labs ordered are listed, but only abnormal results are displayed) Labs Reviewed  POCT URINALYSIS DIP (MANUAL ENTRY)    EKG   Radiology No results found.  Procedures Procedures (including critical care time)  Medications Ordered in UC Medications - No data to display  Initial Impression / Assessment and Plan / UC Course  I have reviewed the triage vital signs and the nursing notes.  Pertinent labs & imaging results that were available during my care of the patient were reviewed by me and considered in my medical decision making (see chart for details).     MDM: 1.  Urinary disorder-Advised patient UA was completely unremarkable this morning. Patient UA was completely unremarkable this morning.  Advised/encouraged patient if irregular voiding pattern and mild bilateral flank pain continues please follow-up with either PCP or urology for further evaluation.  Patient discharged home, hemodynamically stable. Final Clinical Impressions(s) / UC Diagnoses   Final diagnoses:  Urinary disorder     Discharge Instructions      Patient UA was completely unremarkable this morning.  Advised/encouraged patient if irregular voiding pattern and mild bilateral flank pain continues please follow-up with either PCP or urology for further evaluation.     ED Prescriptions   None    PDMP not reviewed this encounter.   , FNP 06/05/21 1122

## 2021-09-17 ENCOUNTER — Other Ambulatory Visit: Payer: Self-pay | Admitting: Internal Medicine

## 2021-09-17 DIAGNOSIS — E782 Mixed hyperlipidemia: Secondary | ICD-10-CM

## 2021-09-22 ENCOUNTER — Ambulatory Visit
Admission: RE | Admit: 2021-09-22 | Discharge: 2021-09-22 | Disposition: A | Discharge status: Home / Self Care | Payer: 59 | Source: Ambulatory Visit | Attending: Internal Medicine | Admitting: Internal Medicine

## 2021-09-22 DIAGNOSIS — E782 Mixed hyperlipidemia: Secondary | ICD-10-CM

## 2021-11-08 ENCOUNTER — Emergency Department (INDEPENDENT_AMBULATORY_CARE_PROVIDER_SITE_OTHER)
Admission: EM | Admit: 2021-11-08 | Discharge: 2021-11-08 | Disposition: A | Discharge status: Home / Self Care | Payer: 59 | Source: Home / Self Care | Attending: Family Medicine | Admitting: Family Medicine

## 2021-11-08 DIAGNOSIS — S39012A Strain of muscle, fascia and tendon of lower back, initial encounter: Secondary | ICD-10-CM

## 2021-11-08 LAB — POCT URINALYSIS DIP (MANUAL ENTRY)
Bilirubin, UA: NEGATIVE
Blood, UA: NEGATIVE
Glucose, UA: NEGATIVE mg/dL
Ketones, POC UA: NEGATIVE mg/dL
Leukocytes, UA: NEGATIVE
Nitrite, UA: NEGATIVE
Protein Ur, POC: NEGATIVE mg/dL
Spec Grav, UA: 1.01 (ref 1.010–1.025)
Urobilinogen, UA: 0.2 E.U./dL
pH, UA: 6 (ref 5.0–8.0)

## 2021-11-08 MED ORDER — METHYLPREDNISOLONE 4 MG PO TBPK: ORAL_TABLET | ORAL | 0 refills | Status: DC

## 2021-11-08 MED ORDER — TIZANIDINE HCL 4 MG PO TABS: 4.0000 mg | ORAL_TABLET | Freq: Four times a day (QID) | ORAL | 0 refills | Status: DC | PRN

## 2021-11-08 NOTE — Discharge Instructions (Signed)
Take the Medrol Dosepak as directed.  This is a steroid anti-inflammatory to help with nerve inflammation.  Take all of day 1 today Take tizanidine as needed as muscle relaxer.  This is helpful at bedtime. Continue with ice or heat to area Activities as tolerated

## 2021-11-08 NOTE — ED Triage Notes (Signed)
Pt c/o back pain since Sunday morning. Radiates down LT side. No hx of sciatica or kidney stones. Pain 8/10 Icing and taking tylenol/advil prn.

## 2021-11-08 NOTE — ED Provider Notes (Signed)
[facility]    CSN: [id number] Arrival date & time: 11/08/21  1257      History   Chief Complaint Chief Complaint  Patient presents with   Back Pain    HPI Elhadj Girton is a 61 y.o. male.   HPI  Pleasant 61 year old gentleman.  States that he has had back pain over the years that was easily treated with an occasional chiropractic visit, Advil, or Tylenol.  Currently has back pain that is more severe than previous.  It is in the left low back, points to his left SI area.  The pain radiates to the left testicle.  Pain with movement.  Better with rest.  No accident, injury, overuse.  He states that it came on after he had gone on a hike.  He states that he hikes most weekends and this is not a change in his usual exertion or habits No numbness or weakness No bowel or bladder complaint No history of kidney stones p No hematuria  Past Medical History:  Diagnosis Date   Dyspnea on exertion 07/18/2018   Hyperlipidemia    Palpitations 07/18/2018   Pre-diabetes     Patient Active Problem List   Diagnosis Date Noted   Palpitations 07/18/2018   Dyspnea on exertion 07/18/2018    History reviewed. No pertinent surgical history.     Home Medications    Prior to Admission medications   Medication Sig Start Date End Date Taking? Authorizing Provider  methylPREDNISolone (MEDROL DOSEPAK) 4 MG TBPK tablet tad 11/08/21  Yes Eustace Moore, MD  tiZANidine (ZANAFLEX) 4 MG tablet Take 1-2 tablets (4-8 mg total) by mouth every 6 (six) hours as needed for muscle spasms. 11/08/21  Yes Eustace Moore, MD  Ascorbic Acid (VITAMIN C) 500 MG CAPS 1 tablet    [provider]  Bempedoic Acid (NEXLETOL) 180 MG TABS Take 180 mg by mouth daily.    [provider]  Cetirizine HCl (ZYRTEC ALLERGY) 10 MG CAPS Take by mouth.    [provider]  Cinnamon 500 MG capsule Take by mouth daily. Unsure of dose    [provider]  ibuprofen (ADVIL) 400 MG  tablet Take 400 mg by mouth every 6 (six) hours as needed.    [provider]  ipratropium (ATROVENT) 0.06 % nasal spray Place 2 sprays into both nostrils 4 (four) times daily. 05/16/20   Lurene Shadow, PA-C  Multiple Vitamin (MULTI-VITAMIN) tablet 1 tablet    [provider]  Multiple Vitamins-Minerals (CENTRUM ADULTS PO) Take by mouth daily as needed. Patient not taking: Reported on 05/16/2020    [provider]  Omega-3 Fatty Acids (FISH OIL) 1000 MG CAPS Take by mouth 2 (two) times daily.    [provider]  Plant Sterols and Stanols (CHOLESTOFF) 450 MG TABS Take by mouth.    [provider]  sildenafil (VIAGRA) 25 MG tablet 1-2 tablet as needed 11/20/19   [provider]    Family History Family History  Problem Relation Age of Onset   Diabetes Mother    Stroke Father    Diabetes Brother    Healthy Brother     Social History Social History   Tobacco Use   Smoking status: Never   Smokeless tobacco: Never  Vaping Use   Vaping Use: Never used  Substance Use Topics   Alcohol use: No   Drug use: Never     Allergies   Crestor [rosuvastatin calcium], Lipitor [atorvastatin calcium],  Livalo [pitavastatin], Bee venom, Ezetimibe, and Metformin   Review of Systems Review of Systems See HPI  Physical Exam Triage Vital Signs ED Triage Vitals  Enc Vitals Group     BP 11/08/21 1302 129/79     Pulse Rate 11/08/21 1302 (!) 51     Resp 11/08/21 1302 17     Temp 11/08/21 1302 98.8 F (37.1 C)     Temp Source 11/08/21 1302 Oral     SpO2 11/08/21 1302 98 %     Weight --      Height --      Head Circumference --      Peak Flow --      Pain Score 11/08/21 1306 8     Pain Loc --      Pain Edu? --      Excl. in GC? --    No data found.  Updated Vital Signs BP 129/79 (BP Location: Right Arm)   Pulse (!) 51   Temp 98.8 F (37.1 C) (Oral)   Resp 17   SpO2 98%       Physical Exam Constitutional:      General: He is  not in acute distress.    Appearance: He is well-developed and normal weight. He is not ill-appearing.     Comments: Guarded movements  HENT:     Head: Normocephalic and atraumatic.  Eyes:     Conjunctiva/sclera: Conjunctivae normal.     Pupils: Pupils are equal, round, and reactive to light.  Cardiovascular:     Rate and Rhythm: Normal rate.  Pulmonary:     Effort: Pulmonary effort is normal. No respiratory distress.  Abdominal:     General: There is no distension.     Palpations: Abdomen is soft.  Musculoskeletal:        General: Normal range of motion.     Cervical back: Normal range of motion.       Back:  Skin:    General: Skin is warm and dry.  Neurological:     General: No focal deficit present.     Mental Status: He is alert.     Sensory: No sensory deficit.     Motor: No weakness.     Coordination: Coordination normal.     Gait: Gait normal.     Deep Tendon Reflexes: Reflexes normal.  Psychiatric:        Mood and Affect: Mood normal.        Behavior: Behavior normal.      UC Treatments / Results  Labs (all labs ordered are listed, but only abnormal results are displayed) Labs Reviewed  POCT URINALYSIS DIP (MANUAL ENTRY)   Dip urinalysis is negative EKG   Radiology No results found.  Procedures Procedures (including critical care time)  Medications Ordered in UC Medications - No data to display  Initial Impression / Assessment and Plan / UC Course  I have reviewed the triage vital signs and the nursing notes.  Pertinent labs & imaging results that were available during my care of the patient were reviewed by me and considered in my medical decision making (see chart for details).     Final Clinical Impressions(s) / UC Diagnoses   Final diagnoses:  Strain of lumbar region, initial encounter     Discharge Instructions      Take the Medrol Dosepak as directed.  This is a steroid anti-inflammatory to help with nerve inflammation.  Take all  of day 1 today Take tizanidine  as needed as muscle relaxer.  This is helpful at bedtime. Continue with ice or heat to area Activities as tolerated    ED Prescriptions     Medication Sig Dispense Auth. Provider   methylPREDNISolone (MEDROL DOSEPAK) 4 MG TBPK tablet tad 21 tablet Eustace Moore, MD   tiZANidine (ZANAFLEX) 4 MG tablet Take 1-2 tablets (4-8 mg total) by mouth every 6 (six) hours as needed for muscle spasms. 21 tablet Eustace Moore, MD      PDMP not reviewed this encounter.   Eustace Moore, MD 11/08/21 1352

## 2021-12-23 ENCOUNTER — Other Ambulatory Visit (HOSPITAL_COMMUNITY): Payer: Self-pay

## 2021-12-23 DIAGNOSIS — E782 Mixed hyperlipidemia: Secondary | ICD-10-CM | POA: Insufficient documentation

## 2021-12-28 ENCOUNTER — Telehealth: Payer: Self-pay | Admitting: Pharmacy Technician

## 2021-12-28 NOTE — Telephone Encounter (Addendum)
Auth Submission: DENIED DENIED DUE TO NOT MEDICALLY NECESSARY. Payer: [employer] Medication & CPT/J Code(s) submitted: Leqvio Parke Simmers) [id number] Route of submission (phone, fax, portal): phone  Phone # [phone] Fax # [phone] Auth type: Buy/Bill Units/visits requested:  Reference number: Approval from:  to  at [facility] as   [facility]: [phone]  Dr. Merri Brunette RN; Kennon Rounds Left v/m with Victorino Dike @ [facility] awaiting call back. Left 2nd v/m 01/07/22 @ 9a (left message w/[facility] receptionist)  Lisette Abu APPEAL DEPT: [phone]  Co-pay card: approved Pat id: [id number] Card id: [id number] PHONE: [phone] FAX EOB: [phone]

## 2022-01-03 ENCOUNTER — Telehealth: Payer: Self-pay | Admitting: Pharmacy Technician

## 2022-01-03 NOTE — Telephone Encounter (Signed)
error 

## 2022-03-10 ENCOUNTER — Ambulatory Visit
Admission: EM | Admit: 2022-03-10 | Discharge: 2022-03-10 | Disposition: A | Discharge status: Home / Self Care | Payer: 59 | Attending: Family Medicine | Admitting: Family Medicine

## 2022-03-10 DIAGNOSIS — J069 Acute upper respiratory infection, unspecified: Secondary | ICD-10-CM | POA: Diagnosis not present

## 2022-03-10 HISTORY — DX: Type 2 diabetes mellitus without complications: E11.9

## 2022-03-10 LAB — POC SARS CORONAVIRUS 2 AG -  ED: SARS Coronavirus 2 Ag: NEGATIVE

## 2022-03-10 NOTE — ED Triage Notes (Signed)
Pt presents to Urgent Care with c/o sore throat, L ear pain, and "soreness in lungs" x 4 days w/ only a mild cough. Has not done COVID test.

## 2022-03-10 NOTE — Discharge Instructions (Addendum)
Drink lots of fluids May take over-the-counter cough and cold medicine as needed Tylenol or ibuprofen for pain COVID test is negative Your doctor if not improving by next week

## 2022-03-10 NOTE — ED Provider Notes (Signed)
[name]    CSN: [id number] Arrival date & time: 03/10/22  1403      History   Chief Complaint Chief Complaint  Patient presents with   Sore Throat   Otalgia   Chest Pain    "Lungs are sore"    HPI Daniel Roberts is a 61 y.o. male.   HPI Patient's been sick for about 4 days.  He has had some sore throat, runny nose, and left ear pain.  Note last couple days he has developed a's "soreness in my lungs".  No shortness of breath.  Mild cough.  No sputum production.  No sweats chills or fever.  No body aches.  Somewhat fatigued.  No known exposure to COVID  Past Medical History:  Diagnosis Date   Diabetes mellitus without complication ([place])    Dyspnea on exertion 07/18/2018   Hyperlipidemia    Palpitations 07/18/2018    Patient Active Problem List   Diagnosis Date Noted   Mixed hyperlipidemia 12/23/2021   Palpitations 07/18/2018   Dyspnea on exertion 07/18/2018    History reviewed. No pertinent surgical history.     Home Medications    Prior to Admission medications   Medication Sig Start Date End Date Taking? Authorizing Provider  Ascorbic Acid (VITAMIN C) 500 MG CAPS 1 tablet    [provider]  Cetirizine HCl (ZYRTEC ALLERGY) 10 MG CAPS Take by mouth.    [provider]  Cinnamon 500 MG capsule Take by mouth daily. Unsure of dose    [provider]  ibuprofen (ADVIL) 400 MG tablet Take 400 mg by mouth every 6 (six) hours as needed.    [provider]  Multiple Vitamin (MULTI-VITAMIN) tablet 1 tablet    [provider]  Omega-3 Fatty Acids (FISH OIL) 1000 MG CAPS Take by mouth 2 (two) times daily.    [provider]  Plant Sterols and Stanols (CHOLESTOFF) 450 MG TABS Take by mouth.    [provider]  sildenafil (VIAGRA) 25 MG tablet 1-2 tablet as needed 11/20/19   [provider]    Family History Family History  Problem Relation Age of Onset   Diabetes Mother    Atrial  fibrillation Mother    Stroke Father    Diabetes Brother    Healthy Brother     Social History Social History   Tobacco Use   Smoking status: Never   Smokeless tobacco: Never  Vaping Use   Vaping Use: Never used  Substance Use Topics   Alcohol use: No   Drug use: Never     Allergies   Crestor [rosuvastatin calcium], Lipitor [atorvastatin calcium], Livalo [pitavastatin], Bee venom, Ezetimibe, and Metformin   Review of Systems Review of Systems  See HPI Physical Exam Triage Vital Signs ED Triage Vitals  Enc Vitals Group     BP 03/10/22 1424 117/75     Pulse Rate 03/10/22 1424 (!) 56     Resp 03/10/22 1424 20     Temp 03/10/22 1424 97.9 F (36.6 C)     Temp Source 03/10/22 1424 Oral     SpO2 03/10/22 1424 97 %     Weight 03/10/22 1420 192 lb (87.1 kg)     Height 03/10/22 1420 6\' 1"  (1.854 m)     Head Circumference --      Peak Flow --      Pain Score 03/10/22 1419 6     Pain Loc --      Pain  Edu? --      Excl. in [place]? --    No data found.  Updated Vital Signs BP 117/75 (BP Location: Left Arm)   Pulse (!) 56   Temp 97.9 F (36.6 C) (Oral)   Resp 20   Ht 6\' 1"  (1.854 m)   Wt 87.1 kg   SpO2 97%   BMI 25.33 kg/m      Physical Exam Constitutional:      General: He is not in acute distress.    Appearance: He is well-developed and normal weight.  HENT:     Head: Normocephalic and atraumatic.     Right Ear: Tympanic membrane and ear canal normal.     Left Ear: Tympanic membrane and ear canal normal.     Nose: Congestion and rhinorrhea present.     Mouth/Throat:     Pharynx: No posterior oropharyngeal erythema.  Eyes:     Conjunctiva/sclera: Conjunctivae normal.     Pupils: Pupils are equal, round, and reactive to light.  Cardiovascular:     Rate and Rhythm: Normal rate and regular rhythm.     Heart sounds: Normal heart sounds.  Pulmonary:     Effort: Pulmonary effort is normal. No respiratory distress.     Breath sounds: Normal breath sounds.   Abdominal:     General: There is no distension.     Palpations: Abdomen is soft.  Musculoskeletal:        General: Normal range of motion.     Cervical back: Normal range of motion.  Skin:    General: Skin is warm and dry.  Neurological:     Mental Status: He is alert.      UC Treatments / Results  Labs (all labs ordered are listed, but only abnormal results are displayed) Labs Reviewed - No data to display  EKG   Radiology No results found.  Procedures Procedures (including critical care time)  Medications Ordered in UC Medications - No data to display  Initial Impression / Assessment and Plan / UC Course  I have reviewed the triage vital signs and the nursing notes.  Pertinent labs & imaging results that were available during my care of the patient were reviewed by me and considered in my medical decision making (see chart for details).     COVID test is negative.  Exam is benign.  Recommend symptomatic care.  Return if not improving by next week Final Clinical Impressions(s) / UC Diagnoses   Final diagnoses:  Viral upper respiratory tract infection     Discharge Instructions      Drink lots of fluids May take over-the-counter cough and cold medicine as needed Tylenol or ibuprofen for pain COVID test is negative Your doctor if not improving by next week     ED Prescriptions   None    PDMP not reviewed this encounter.   Raylene Everts, MD 03/10/22 [phone]

## 2022-03-14 ENCOUNTER — Encounter: Payer: Self-pay | Admitting: Pulmonary Disease

## 2022-10-12 ENCOUNTER — Encounter: Payer: Self-pay | Admitting: Cardiology

## 2022-10-12 ENCOUNTER — Other Ambulatory Visit: Payer: Self-pay | Admitting: Cardiology

## 2022-10-12 ENCOUNTER — Ambulatory Visit: Payer: 59 | Admitting: Cardiology

## 2022-10-12 VITALS — BP 107/72 | HR 56 | Resp 16 | Ht 73.0 in | Wt 196.0 lb

## 2022-10-12 DIAGNOSIS — I480 Paroxysmal atrial fibrillation: Secondary | ICD-10-CM

## 2022-10-12 DIAGNOSIS — I2510 Atherosclerotic heart disease of native coronary artery without angina pectoris: Secondary | ICD-10-CM | POA: Insufficient documentation

## 2022-10-12 DIAGNOSIS — R002 Palpitations: Secondary | ICD-10-CM

## 2022-10-12 MED ORDER — RIVAROXABAN 20 MG PO TABS: 20.0000 mg | ORAL_TABLET | Freq: Every day | ORAL | 2 refills | Status: DC

## 2022-10-12 MED ORDER — MULTAQ 400 MG PO TABS: 400.0000 mg | ORAL_TABLET | Freq: Two times a day (BID) | ORAL | 2 refills | Status: DC

## 2022-10-12 NOTE — Progress Notes (Addendum)
Patient referred by Merri Brunette, MD for palpitations  Subjective:   Daniel Roberts, male    DOB: Aug 29, 1960, 62 y.o.   MRN: [id number]  Chief Complaint  Patient presents with   Palpitations   New Patient (Initial Visit)    HPI  62 y.o. Caucasian male with hyperlipidemia, type 2 DM, elevated coronary calcium score, referred for possible Afib  Patient was last seen by Dr. Jacinto Halim in 2020 with palpitations. Workup at that time was unremarkable. He has had recent increase in palpitations that led to [facility] monitor through PCP Dr. Renne Crigler. Zio monitor personally reviewed and interpreted by me-showed paroxysmal Afib/flutter (<1% burden), see details below. He denies chest pain, has occasional dyspnea during fib episodes. He denies excessive caffeine and alcohol use. He snores, denies apneic episodes. He has never had sleep study.  Separately. He had elevated CA score in 09/2021. He did not tolerate rosuvastatin and another statin (not atorvastatin) due to myalgias. He was previously on Metformin for diabetes, currently not on any medication. He has an upcoming appt with PCP to discuss the same.     Past Medical History:  Diagnosis Date   Diabetes mellitus without complication (HCC)    Dyspnea on exertion 07/18/2018   Hyperlipidemia    Palpitations 07/18/2018     History reviewed. No pertinent surgical history.   Social History   Tobacco Use  Smoking Status Never  Smokeless Tobacco Never    Social History   Substance and Sexual Activity  Alcohol Use No     Family History  Problem Relation Age of Onset   Diabetes Mother    Atrial fibrillation Mother    Stroke Father    Diabetes Brother    Healthy Brother       Current Outpatient Medications:    Ascorbic Acid (VITAMIN C) 500 MG CAPS, 1 tablet, Disp: , Rfl:    Cetirizine HCl (ZYRTEC ALLERGY) 10 MG CAPS, Take by mouth., Disp: , Rfl:    Cinnamon 500 MG capsule, Take by mouth daily. Unsure of dose, Disp: , Rfl:     ibuprofen (ADVIL) 400 MG tablet, Take 400 mg by mouth every 6 (six) hours as needed., Disp: , Rfl:    Multiple Vitamin (MULTI-VITAMIN) tablet, 1 tablet, Disp: , Rfl:    Omega-3 Fatty Acids (FISH OIL) 1000 MG CAPS, Take by mouth 2 (two) times daily., Disp: , Rfl:    Plant Sterols and Stanols (CHOLESTOFF) 450 MG TABS, Take by mouth., Disp: , Rfl:    sildenafil (VIAGRA) 25 MG tablet, 1-2 tablet as needed, Disp: , Rfl:    Turmeric (QC TUMERIC COMPLEX PO), Take by mouth., Disp: , Rfl:    Cardiovascular and other pertinent studies:  Reviewed external labs and tests, independently interpreted  EKG 10/12/2022: Sinus bradycardia 52 bpm  Incomplete RBBB  Mobile cardiac telemetry 13 days 09/21/2022 - 10/05/2022: Dominant rhythm: Sinus. HR 42-145 bpm. Avg HR 63 bpm, in sinus rhythm. Atrial fibrillation/flutter <1% burden, average ventricular rate of 139 bpm.  Longest episode lasted 1 hour 6-minute, average rate 142 bpm.  A-fib/flutter was detected within +/- 45 seconds of symptomatic patient events.   Stated episodes of SVT are also likely to be A-fib/flutter. <1% isolated SVE, couplet/triplets. 1 episode of VT, at 169 bpm for 6 beats. <1% isolated VE, couplets. No high grade AV block, sinus pause >3sec noted. 28 patient triggered events, correlated with both sinus rhythm and Afib  CT cardiac scoring 09/23/2021: LM; 0 LAD: 288 LCx: 0 RCA:  1   Total score: 289 MESA database percentile: 80    Echocardiogram 04/03/2019: Left ventricle cavity is normal in size and thickness. Normal LV systolic function with visual EF 50-55%. Normal global wall motion. Normal diastolic filling pattern.  Trileaflet aortic valve. Mild (Grade I) aortic regurgitation. Mild (Grade I) mitral regurgitation. IVC is dilated with respiratory variation. Estimated RA pressure 8 mmHg.  Exercise Treadmill Stress Test 04/28/2017: Indication: Dyspnea in a diabetic patient   The patient exercised on Bruce protocol for 13:43  min. Patient achieved  9.06 METS and reached HR  146 bpm, which is  89 % of maximum age-predicted HR.   Stress test terminated due to met target heart rate. Exercise capacity was normal.   HR Response to Exercise: Appropriate   BP Response to Exercise: Normal resting BP- appropriate response Chest Pain: none. Arrhythmias: none ST Changes: With peak exercise there was no ST-T changes of ischemia.   Overall Impression: Normal stress test. Continue primary/secondary prevention.   Recent labs: 09/16/2022: Glucose 163, BUN/Cr 16/0.89. EGFR 96. Na/K 140/4.9. Rest of the CMP normal H/H 15/46. MCV 88. Platelets 326 HbA1C 7.0% Chol 180, TG 76, HDL 37, LDL 129  09/10/2022: H/H 15/44. MCV 85. Platelets 299   Review of Systems  Cardiovascular:  Positive for dyspnea on exertion (During Afib episodes) and palpitations. Negative for chest pain, leg swelling and syncope.        Vitals:   10/12/22 0957  BP: 107/72  Pulse: (!) 56  Resp: 16  SpO2: 96%     Body mass index is 25.86 kg/m. Filed Weights   10/12/22 0957  Weight: 196 lb (88.9 kg)     Objective:   Physical Exam Vitals and nursing note reviewed.  Constitutional:      General: He is not in acute distress. Neck:     Vascular: No JVD.  Cardiovascular:     Rate and Rhythm: Normal rate and regular rhythm.     Heart sounds: Normal heart sounds. No murmur heard. Pulmonary:     Effort: Pulmonary effort is normal.     Breath sounds: Normal breath sounds. No wheezing or rales.  Musculoskeletal:     Right lower leg: No edema.     Left lower leg: No edema.         Visit diagnoses:   ICD-10-CM   1. Palpitations  R00.2 EKG 12-Lead    2. PAF (paroxysmal atrial fibrillation) (HCC)  I48.0 PCV ECHOCARDIOGRAM COMPLETE    Ambulatory referral to Sleep Studies    CANCELED: Ambulatory referral to Sleep Studies    3. Coronary artery disease involving native coronary artery of native heart without angina pectoris  I25.10 PCV  ECHOCARDIOGRAM COMPLETE    PCV MYOCARDIAL PERFUSION WO LEXISCAN       Orders Placed This Encounter  Procedures   Ambulatory referral to Sleep Studies   PCV MYOCARDIAL PERFUSION WO LEXISCAN   EKG 12-Lead   PCV ECHOCARDIOGRAM COMPLETE     Medication changes this visit: Medications Discontinued During This Encounter  Medication Reason   ibuprofen (ADVIL) 400 MG tablet Discontinued by provider    Meds ordered this encounter  Medications   rivaroxaban (XARELTO) 20 MG TABS tablet    Sig: Take 1 tablet (20 mg total) by mouth daily with supper.    Dispense:  30 tablet    Refill:  2   dronedarone (MULTAQ) 400 MG tablet    Sig: Take 1 tablet (400 mg total) by mouth 2 (two) times  daily with a meal.    Dispense:  60 tablet    Refill:  2     Assessment & Recommendations:   62 y.o. Caucasian male with hyperlipidemia, type 2 DM, elevated coronary calcium score, referred for possible Afib  Paroxysmal Afib: <1% burden, but symptomatic. Discussed rate vs rhythm control. Given that resting heart rate is controlled, and he is symptomatic from Afib, rhythm control is more attractive. Discussed AAD vs referral for ablation. We mutually decided to pursue AAD with Multaq 400 mg bid at this time., CHA2DS2VASc score 2, annual stroke risk 2.2% (CAD, DM) Recommend Xarelto 20 mg daily. Avoid regular use of NSAIDS.  Elevated CA score, CAD without angina: 288 in LAD> Recommend exercise nuclear stress test, echocardiogram for risk stratification. Recommend Lipitor. He wants to hold off until next visit. Will address again at next visit.  Further recommendations after above testing.   Thank you for referring the patient to [place]. Please feel free to contact with any questions.   Elder Negus, MD Pager: [phone] Office: [phone]

## 2022-10-14 NOTE — Telephone Encounter (Signed)
From pharmacy

## 2022-10-19 ENCOUNTER — Telehealth: Payer: Self-pay

## 2022-10-19 NOTE — Telephone Encounter (Signed)
Patient is having a allergic reaction to either Multaq or xarelto. He has not taken multaq this morning. Symptoms are irritation in corners of mouth,Itchy neck,rash forearms and back of knees, eyes feel itchy and  irritated, NO shortness of breath.

## 2022-10-19 NOTE — Telephone Encounter (Signed)
Patient informed. He will pick up Eliquis 5mg .

## 2022-10-19 NOTE — Telephone Encounter (Signed)
He does have history of allergic reactions to medications. Hard ot tell if it is multaq or Xarelto. I would stop both. Okay to use over the counter benadryl 25 mg PO. We may need to try alternate blood thinner. Could pick up some Eliquis 5 mg samples from office to start next week after rash has resolved.  Thanks MJP

## 2022-10-20 ENCOUNTER — Encounter: Payer: Self-pay | Admitting: Pulmonary Disease

## 2022-10-21 ENCOUNTER — Ambulatory Visit: Payer: 59 | Admitting: Cardiology

## 2022-10-31 ENCOUNTER — Other Ambulatory Visit: Payer: 59

## 2022-11-04 ENCOUNTER — Other Ambulatory Visit: Payer: 59

## 2022-11-14 ENCOUNTER — Ambulatory Visit: Payer: 59

## 2022-11-14 DIAGNOSIS — I2510 Atherosclerotic heart disease of native coronary artery without angina pectoris: Secondary | ICD-10-CM

## 2022-11-16 ENCOUNTER — Ambulatory Visit: Payer: 59

## 2022-11-16 ENCOUNTER — Encounter: Payer: Self-pay | Admitting: Pulmonary Disease

## 2022-11-16 DIAGNOSIS — I2510 Atherosclerotic heart disease of native coronary artery without angina pectoris: Secondary | ICD-10-CM

## 2022-11-16 DIAGNOSIS — I480 Paroxysmal atrial fibrillation: Secondary | ICD-10-CM

## 2022-11-17 ENCOUNTER — Ambulatory Visit: Payer: 59 | Admitting: Cardiology

## 2022-11-21 NOTE — Progress Notes (Signed)
Patient referred by Merri Brunette, MD for palpitations  Subjective:   Daniel Roberts, male    DOB: Mar 10, 1961, 62 y.o.   MRN: [id number]  Chief Complaint  Patient presents with   Palpitations   Follow-up    4 week    HPI  62 y.o. Caucasian male with hyperlipidemia, type 2 DM, elevated coronary calcium score, PAF  Patient started on Xarelto and Multaq but had some skin rash. He since stopped Multaq and changed Xarelto to Eliquis. He has not had any recurrent rash, has had some mild itching. He wants to wait another 10 days before starting Multaq. Reviewed recent test results with the patient, details below. Sleep study is pending. He has lost 10 lbs weight intentionally.   Initial consultation visit 10/2022: Patient was last seen by Dr. Jacinto Halim in 2020 with palpitations. Workup at that time was unremarkable. He has had recent increase in palpitations that led to [facility] monitor through PCP Dr. Renne Crigler. Zio monitor personally reviewed and interpreted by me-showed paroxysmal Afib/flutter (<1% burden), see details below. He denies chest pain, has occasional dyspnea during fib episodes. He denies excessive caffeine and alcohol use. He snores, denies apneic episodes. He has never had sleep study.  Separately. He had elevated CA score in 09/2021. He did not tolerate rosuvastatin and another statin (not atorvastatin) due to myalgias. He was previously on Metformin for diabetes, currently not on any medication. He has an upcoming appt with PCP to discuss the same.     Current Outpatient Medications:    apixaban (ELIQUIS) 5 MG TABS tablet, Take 5 mg by mouth 2 (two) times daily., Disp: , Rfl:    Ascorbic Acid (VITAMIN C) 500 MG CAPS, 1 tablet, Disp: , Rfl:    Cetirizine HCl (ZYRTEC ALLERGY) 10 MG CAPS, Take by mouth., Disp: , Rfl:    Cinnamon 500 MG capsule, Take by mouth daily. Unsure of dose, Disp: , Rfl:    Multiple Vitamin (MULTI-VITAMIN) tablet, 1 tablet, Disp: , Rfl:    Omega-3 Fatty Acids  (FISH OIL) 1000 MG CAPS, Take by mouth 2 (two) times daily., Disp: , Rfl:    Plant Sterols and Stanols (CHOLESTOFF) 450 MG TABS, Take by mouth., Disp: , Rfl:    sildenafil (VIAGRA) 25 MG tablet, 1-2 tablet as needed, Disp: , Rfl:    Turmeric (QC TUMERIC COMPLEX PO), Take by mouth., Disp: , Rfl:    Cardiovascular and other pertinent studies:  Reviewed external labs and tests, independently interpreted  EKG 10/12/2022: Sinus bradycardia 52 bpm  Incomplete RBBB  Echocardiogram 11/16/2022: Left ventricle cavity is normal in size. Normal left ventricular wall thickness. Normal global wall motion. Normal LV systolic function with EF 64%. Normal diastolic filling pattern. Trileaflet aortic valve. Mild to moderate, posteriorly directed aortic regurgitation. Mild MR noted on previous study in 2020, not noted on this study.   Exercise nuclear stress test 11/14/2022: Myocardial perfusion is normal. Overall LV systolic function is normal without regional wall motion abnormalities. Stress LV EF: 51%.  Normal ECG stress. The patient exercised for 9 minutes and 59 seconds of a Bruce protocol, achieving approximately 11.8 METs & 89% MPHR. No chest pain. Stress terminated due to fatigue. The blood pressure response was normal. No previous exam available for comparison. Low risk.     Mobile cardiac telemetry 13 days 09/21/2022 - 10/05/2022: Dominant rhythm: Sinus. HR 42-145 bpm. Avg HR 63 bpm, in sinus rhythm. Atrial fibrillation/flutter <1% burden, average ventricular rate of 139 bpm.  Longest episode  lasted 1 hour 6-minute, average rate 142 bpm.  A-fib/flutter was detected within +/- 45 seconds of symptomatic patient events.   Stated episodes of SVT are also likely to be A-fib/flutter. <1% isolated SVE, couplet/triplets. 1 episode of VT, at 169 bpm for 6 beats. <1% isolated VE, couplets. No high grade AV block, sinus pause >3sec noted. 28 patient triggered events, correlated with both sinus rhythm and  Afib  CT cardiac scoring 09/23/2021: LM; 0 LAD: 288 LCx: 0 RCA: 1   Total score: 289 MESA database percentile: 80    Echocardiogram 04/03/2019: Left ventricle cavity is normal in size and thickness. Normal LV systolic function with visual EF 50-55%. Normal global wall motion. Normal diastolic filling pattern.  Trileaflet aortic valve. Mild (Grade I) aortic regurgitation. Mild (Grade I) mitral regurgitation. IVC is dilated with respiratory variation. Estimated RA pressure 8 mmHg.  Exercise Treadmill Stress Test 04/28/2017: Indication: Dyspnea in a diabetic patient   The patient exercised on Bruce protocol for 13:43 min. Patient achieved  9.06 METS and reached HR  146 bpm, which is  89 % of maximum age-predicted HR.   Stress test terminated due to met target heart rate. Exercise capacity was normal.   HR Response to Exercise: Appropriate   BP Response to Exercise: Normal resting BP- appropriate response Chest Pain: none. Arrhythmias: none ST Changes: With peak exercise there was no ST-T changes of ischemia.   Overall Impression: Normal stress test. Continue primary/secondary prevention.   Recent labs: 09/16/2022: Glucose 163, BUN/Cr 16/0.89. EGFR 96. Na/K 140/4.9. Rest of the CMP normal H/H 15/46. MCV 88. Platelets 326 HbA1C 7.0% Chol 180, TG 76, HDL 37, LDL 129  09/10/2022: H/H 15/44. MCV 85. Platelets 299   Review of Systems  Cardiovascular:  Positive for dyspnea on exertion (During Afib episodes) and palpitations. Negative for chest pain, leg swelling and syncope.        Vitals:   11/25/22 0934  BP: 126/73  Pulse: (!) 58  Resp: 16  SpO2: 97%      Body mass index is 24.8 kg/m. Filed Weights   11/25/22 0934  Weight: 188 lb (85.3 kg)      Objective:   Physical Exam Vitals and nursing note reviewed.  Constitutional:      General: He is not in acute distress. Neck:     Vascular: No JVD.  Cardiovascular:     Rate and Rhythm: Normal rate and regular  rhythm.     Heart sounds: Normal heart sounds. No murmur heard. Pulmonary:     Effort: Pulmonary effort is normal.     Breath sounds: Normal breath sounds. No wheezing or rales.  Musculoskeletal:     Right lower leg: No edema.     Left lower leg: No edema.         Visit diagnoses:   ICD-10-CM   1. PAF (paroxysmal atrial fibrillation) (HCC)  I48.0     2. Coronary artery disease involving native coronary artery of native heart without angina pectoris  I25.10     3. Mixed hyperlipidemia  E78.2         Medication changes this visit: Meds ordered this encounter  Medications   apixaban (ELIQUIS) 5 MG TABS tablet    Sig: Take 1 tablet (5 mg total) by mouth 2 (two) times daily.    Dispense:  180 tablet    Refill:  3   dronedarone (MULTAQ) 400 MG tablet    Sig: Take 1 tablet (400 mg total) by mouth  2 (two) times daily with a meal.    Dispense:  180 tablet    Refill:  3     Assessment & Recommendations:    62 y.o. Caucasian male with hyperlipidemia, type 2 DM, elevated coronary calcium score, PAF  Paroxysmal Afib: <1% burden, but symptomatic. He will start Multaq 400 mg bid soon. If recurrent Afib on Multaq, would recommend EP referral and consideration for ablation. CHA2DS2VASc score 2, annual stroke risk 2.2% (CAD, DM) Continue Eliquis 5 mg bid.  Elevated CA score, CAD without angina: 288 in LAD. No ischemia on stress testing (10/2022). Recommended Lipitor. He wants to hold off at this time, Will discuss again at next visit.  F/u in 3 months    Elder Negus, MD Pager: [phone] Office: [phone]

## 2022-11-25 ENCOUNTER — Encounter: Payer: Self-pay | Admitting: Cardiology

## 2022-11-25 ENCOUNTER — Ambulatory Visit: Payer: 59 | Admitting: Cardiology

## 2022-11-25 VITALS — BP 126/73 | HR 58 | Resp 16 | Ht 73.0 in | Wt 188.0 lb

## 2022-11-25 DIAGNOSIS — E782 Mixed hyperlipidemia: Secondary | ICD-10-CM

## 2022-11-25 DIAGNOSIS — I480 Paroxysmal atrial fibrillation: Secondary | ICD-10-CM

## 2022-11-25 DIAGNOSIS — I2510 Atherosclerotic heart disease of native coronary artery without angina pectoris: Secondary | ICD-10-CM

## 2022-11-25 MED ORDER — APIXABAN 5 MG PO TABS: 5.0000 mg | ORAL_TABLET | Freq: Two times a day (BID) | ORAL | 3 refills | Status: DC

## 2022-11-25 MED ORDER — MULTAQ 400 MG PO TABS: 400.0000 mg | ORAL_TABLET | Freq: Two times a day (BID) | ORAL | 3 refills | Status: DC

## 2023-01-05 ENCOUNTER — Ambulatory Visit
Admission: EM | Admit: 2023-01-05 | Discharge: 2023-01-05 | Disposition: A | Discharge status: Home / Self Care | Payer: 59 | Attending: Family Medicine | Admitting: Family Medicine

## 2023-01-05 ENCOUNTER — Encounter: Payer: Self-pay | Admitting: Pulmonary Disease

## 2023-01-05 DIAGNOSIS — K122 Cellulitis and abscess of mouth: Secondary | ICD-10-CM | POA: Diagnosis not present

## 2023-01-05 DIAGNOSIS — J029 Acute pharyngitis, unspecified: Secondary | ICD-10-CM

## 2023-01-05 HISTORY — DX: Unspecified atrial fibrillation: I48.91

## 2023-01-05 MED ORDER — PREDNISONE 20 MG PO TABS: ORAL_TABLET | ORAL | 0 refills | Status: DC

## 2023-01-05 MED ORDER — AMOXICILLIN 875 MG PO TABS: 875.0000 mg | ORAL_TABLET | Freq: Two times a day (BID) | ORAL | 0 refills | Status: AC

## 2023-01-05 NOTE — ED Triage Notes (Signed)
Pt reports sore throat x 2 days. Losing voice today, and reports tightness in throat.  Denies fever, body aches.  States he hasn't felt great but that's not abnormal since he started a new med (Xarelto).

## 2023-01-05 NOTE — Discharge Instructions (Addendum)
Advised patient to take medication as directed with food to completion.  Advised patient to take Prednisone with first dose of Amoxicillin for the next 5 of 7 days.  Encouraged to increase daily water intake to 64 ounces per day while taking these medications.  Advised if symptoms worsen and/or unresolved please follow-up with PCP or here for further evaluation.

## 2023-01-05 NOTE — ED Provider Notes (Signed)
[facility]    CSN: [id number] Arrival date & time: 01/05/23  1909      History   Chief Complaint Chief Complaint  Patient presents with   Sore Throat    HPI Daniel Roberts is a 62 y.o. male.   HPI 62 year old male presents with sore throat for 2 days.  Advised has not felt normal since starting new medicine Xarelto for atrial fibrillation (patient denies any current bleeding).  PMH significant for obesity and HLD.   Past Medical History:  Diagnosis Date   Atrial fibrillation (HCC)    Diabetes mellitus without complication (HCC)    Dyspnea on exertion 07/18/2018   Hyperlipidemia    Palpitations 07/18/2018    Patient Active Problem List   Diagnosis Date Noted   PAF (paroxysmal atrial fibrillation) (HCC) 10/12/2022   Coronary artery disease involving native coronary artery of native heart without angina pectoris 10/12/2022   Mixed hyperlipidemia 12/23/2021   Palpitations 07/18/2018   Dyspnea on exertion 07/18/2018    History reviewed. No pertinent surgical history.     Home Medications    Prior to Admission medications   Medication Sig Start Date End Date Taking? Authorizing Provider  amoxicillin (AMOXIL) 875 MG tablet Take 1 tablet (875 mg total) by mouth 2 (two) times daily for 7 days. 01/05/23 01/12/23 Yes Trevor Iha, FNP  apixaban (ELIQUIS) 5 MG TABS tablet Take 1 tablet (5 mg total) by mouth 2 (two) times daily. 11/25/22  Yes Patwardhan, Anabel Bene, MD  dronedarone (MULTAQ) 400 MG tablet Take 1 tablet (400 mg total) by mouth 2 (two) times daily with a meal. 11/25/22  Yes Patwardhan, Manish J, MD  predniSONE (DELTASONE) 20 MG tablet Take 3 tabs PO daily x 5 days. 01/05/23  Yes Trevor Iha, FNP  Ascorbic Acid (VITAMIN C) 500 MG CAPS 1 tablet    [provider]  Cetirizine HCl (ZYRTEC ALLERGY) 10 MG CAPS Take by mouth.    [provider]  Cholecalciferol (VITAMIN D-3) 25 MCG (1000 UT) CAPS Take by mouth.    [provider]   Cinnamon 500 MG capsule Take by mouth daily. Unsure of dose    [provider]  Multiple Vitamin (MULTI-VITAMIN) tablet 1 tablet    [provider]  Omega-3 Fatty Acids (FISH OIL) 1000 MG CAPS Take by mouth 2 (two) times daily. Patient not taking: Reported on 11/25/2022    [provider]  Plant Sterols and Stanols (CHOLESTOFF) 450 MG TABS Take by mouth.    [provider]  psyllium (REGULOID) 0.52 g capsule Take 0.52 g by mouth daily.    [provider]  sildenafil (VIAGRA) 25 MG tablet 1-2 tablet as needed 11/20/19   [provider]  Zinc 50 MG TABS Take by mouth.    [provider]    Family History Family History  Problem Relation Age of Onset   Diabetes Mother    Atrial fibrillation Mother    Stroke Father    Diabetes Brother    Healthy Brother     Social History Social History   Tobacco Use   Smoking status: Never   Smokeless tobacco: Never  Vaping Use   Vaping status: Never Used  Substance Use Topics   Alcohol use: No   Drug use: Never     Allergies   Crestor [rosuvastatin calcium], Lipitor [atorvastatin calcium], Livalo [pitavastatin], Bee venom, Ezetimibe, and Metformin   Review of Systems Review of Systems  All other systems reviewed and are  negative.    Physical Exam Triage Vital Signs ED Triage Vitals [01/05/23 1925]  Encounter Vitals Group     BP      Systolic BP Percentile      Diastolic BP Percentile      Pulse      Resp      Temp      Temp src      SpO2      Weight      Height      Head Circumference      Peak Flow      Pain Score 5     Pain Loc      Pain Education      Exclude from Growth Chart    No data found.  Updated Vital Signs There were no vitals taken for this visit.   Physical Exam Vitals reviewed.  Constitutional:      General: He is not in acute distress.    Appearance: He is well-developed and normal weight. He is not ill-appearing.  HENT:     Head:  Normocephalic and atraumatic.     Right Ear: Tympanic membrane and ear canal normal.     Left Ear: Tympanic membrane and ear canal normal.     Mouth/Throat:     Mouth: Mucous membranes are moist.     Pharynx: Oropharynx is clear. Uvula midline. Posterior oropharyngeal erythema and uvula swelling present.  Eyes:     Conjunctiva/sclera: Conjunctivae normal.     Pupils: Pupils are equal, round, and reactive to light.  Cardiovascular:     Rate and Rhythm: Normal rate and regular rhythm.     Heart sounds: Normal heart sounds.  Pulmonary:     Effort: Pulmonary effort is normal.     Breath sounds: Normal breath sounds. No wheezing, rhonchi or rales.  Musculoskeletal:     Cervical back: Normal range of motion and neck supple.  Skin:    General: Skin is warm and dry.  Neurological:     General: No focal deficit present.     Mental Status: He is alert and oriented to person, place, and time.      UC Treatments / Results  Labs (all labs ordered are listed, but only abnormal results are displayed) Labs Reviewed - No data to display  EKG   Radiology No results found.  Procedures Procedures (including critical care time)  Medications Ordered in UC Medications - No data to display  Initial Impression / Assessment and Plan / UC Course  I have reviewed the triage vital signs and the nursing notes.  Pertinent labs & imaging results that were available during my care of the patient were reviewed by me and considered in my medical decision making (see chart for details).     MDM: 1.  Uvulitis-Rx'd Amoxicillin 875 mg twice daily x 7 days; 2.  Sore throat-Rx'd prednisone 60 mg daily x 5 days. Advised patient to take medication as directed with food to completion.  Advised patient to take Prednisone with first dose of Amoxicillin for the next 5 of 7 days.  Encouraged to increase daily water intake to 64 ounces per day while taking these medications.  Advised if symptoms worsen and/or  unresolved please follow-up with PCP or here for further evaluation.  Patient discharged home, hemodynamically stable please Final Clinical Impressions(s) / UC Diagnoses   Final diagnoses:  Uvulitis  Sore throat     Discharge Instructions      Advised patient to take  medication as directed with food to completion.  Advised patient to take Prednisone with first dose of Amoxicillin for the next 5 of 7 days.  Encouraged to increase daily water intake to 64 ounces per day while taking these medications.  Advised if symptoms worsen and/or unresolved please follow-up with PCP or here for further evaluation.     ED Prescriptions     Medication Sig Dispense Auth. Provider   predniSONE (DELTASONE) 20 MG tablet Take 3 tabs PO daily x 5 days. 15 tablet Trevor Iha, FNP   amoxicillin (AMOXIL) 875 MG tablet Take 1 tablet (875 mg total) by mouth 2 (two) times daily for 7 days. 14 tablet Trevor Iha, FNP      PDMP not reviewed this encounter.   Trevor Iha, FNP 01/05/23 2007

## 2023-01-19 IMAGING — CT CT CARDIAC CORONARY ARTERY CALCIUM SCORE
1 of 3 series · 9 of 20 positions shown, 12 images · non-contrast
Comparison: None Available.

CLINICAL DATA: 61-year-old Caucasian male

EXAM:
CT CARDIAC CORONARY ARTERY CALCIUM SCORE
TECHNIQUE: Non-contrast imaging through the heart was performed using
prospective ECG gating. Image post processing was performed on an
independent workstation, allowing for quantitative analysis of the
heart and coronary arteries. Note that this exam targets the heart
and the chest was not imaged in its entirety.

[Series 3: thin sfov · axial · 0.39mm/px · z∈[-93,+16]mm · 9 of 137 slices shown, 12 images]
[im 14/137  vessel]
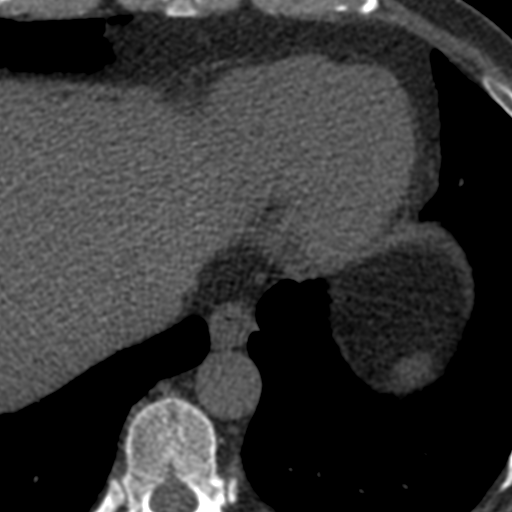
[im 14/137  lung]
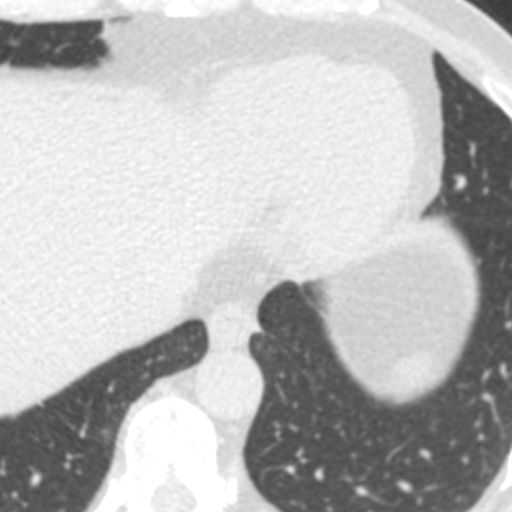
[im 28/137  vessel]
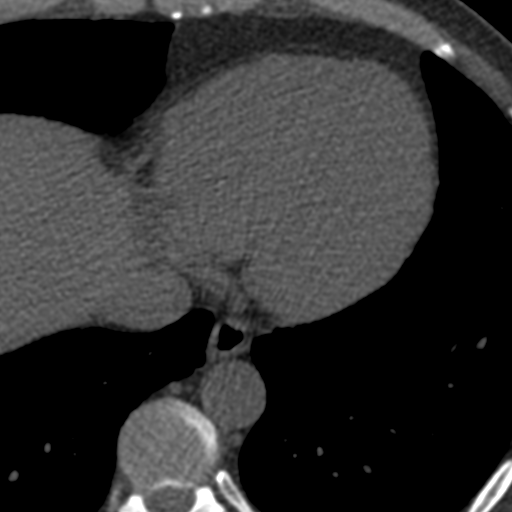
[im 41/137  vessel]
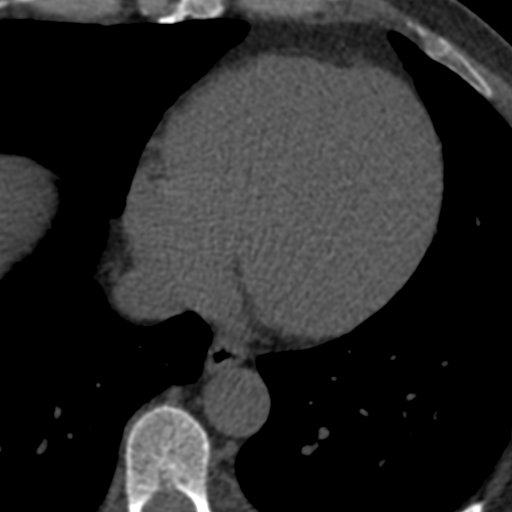
[im 55/137  vessel]
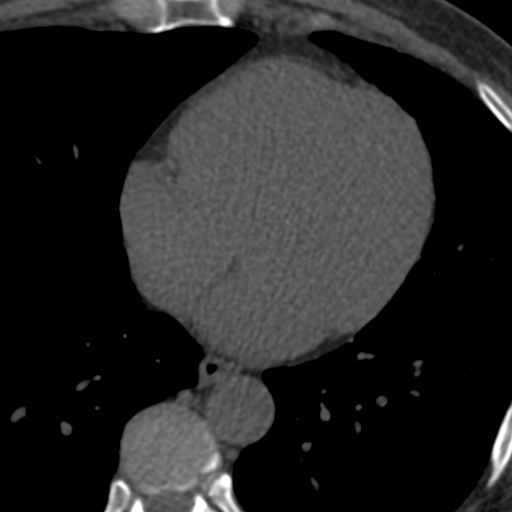
[im 69/137  vessel]
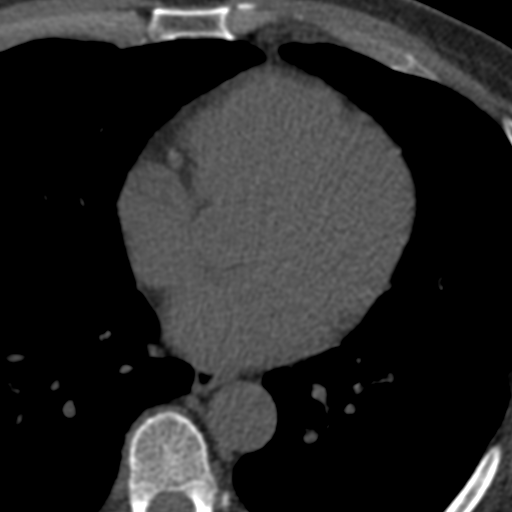
[im 69/137  lung]
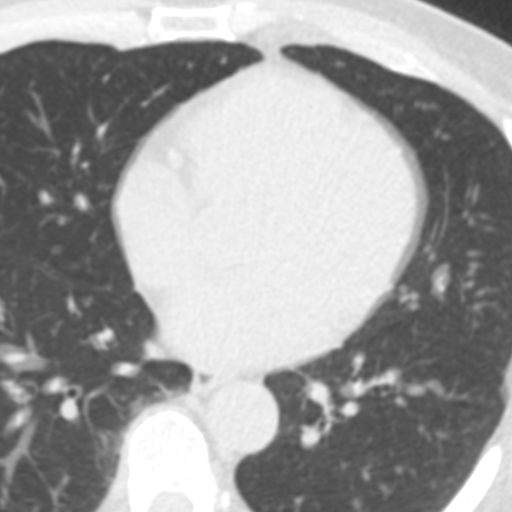
[im 82/137  vessel]
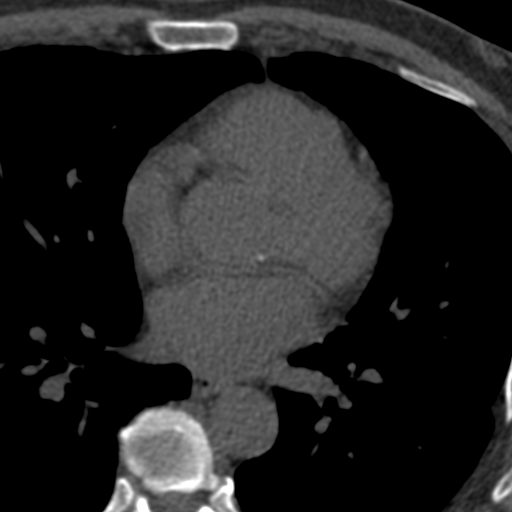
[im 96/137  vessel]
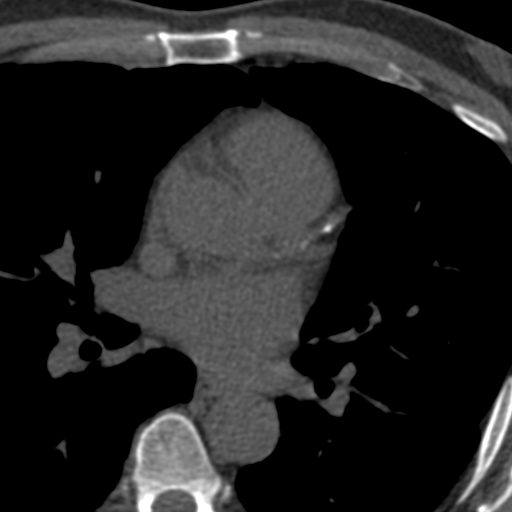
[im 109/137  vessel]
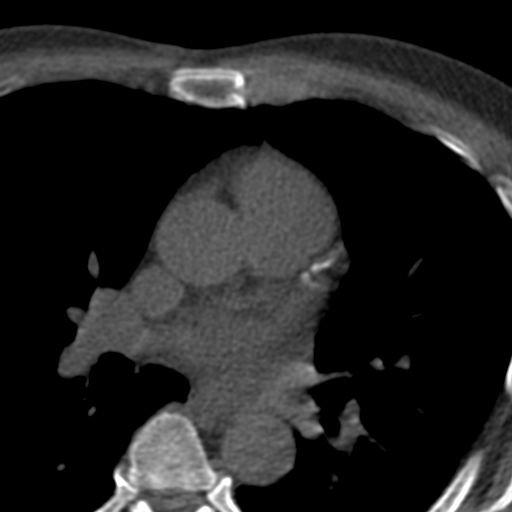
[im 123/137  vessel]
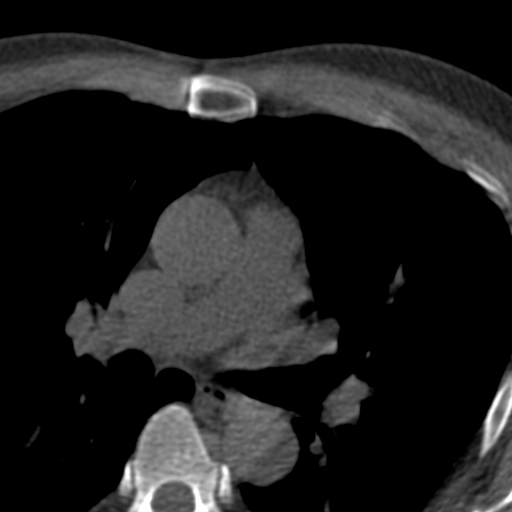
[im 123/137  lung]
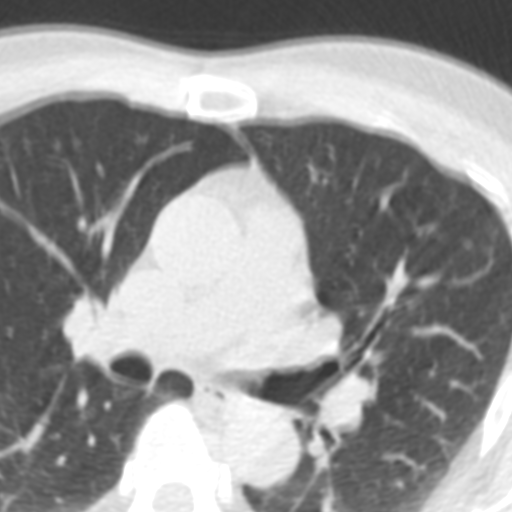

[9 of 20 positions shown; findings below may reference images not displayed]

FINDINGS: Technical quality: Good

CORONARY CALCIUM SCORES:

Left Main: No coronary artery calcification

LAD: 288

LCx: No coronary calcification

RCA:

CORONARY CALCIUM

Total Agatston Score: 289

[HOSPITAL] percentile: 80

Ascending aorta (normal <  40 mm): 33 mm

EXTRACARDIAC FINDINGS:

Limited view of the lung parenchyma demonstrates no suspicious
nodularity. Airways are normal.

Limited view of the mediastinum demonstrates no adenopathy.
Esophagus normal.

Limited view of the upper abdomen is unremarkable.

Limited view of the skeleton and chest wall is unremarkable.
IMPRESSION: 1. LAD coronary artery calcification.

2. Total Agatston Score: 289

3. MESA age and sex matched database percentile: 80th

## 2023-02-24 ENCOUNTER — Ambulatory Visit: Payer: Self-pay | Admitting: Cardiology

## 2023-04-27 ENCOUNTER — Encounter: Payer: Self-pay | Admitting: Cardiology

## 2023-05-31 ENCOUNTER — Ambulatory Visit: Payer: 59 | Admitting: Cardiology

## 2023-07-19 NOTE — Progress Notes (Unsigned)
 Cardiology Office Note:  .   Date:  07/20/2023  ID:  Daniel Roberts, DOB Jan 13, 1961, MRN [id number] PCP: Merri Brunette, MD  [facility] Providers Cardiologist:  Truett Mainland, MD PCP: Merri Brunette, MD  Chief Complaint  Patient presents with   PAF      History of Present Illness: .    Daniel Roberts is a 63 y.o. male with hyperlipidemia, type 2 DM, elevated coronary calcium score, PAF   Patient is staying active, walking as well as playing basketball from time to time.  He denies any complains of chest pain or shortness of breath.  He has occasional lightheadedness and palpitations.  Some of the palpitation episodes reported with A-fib, but usually do not last for more than 10 minutes.  He is taking and tolerating Multaq regularly.  In the past, he has tried Xarelto for anticoagulation, which correlated with rash on abdomen and buttocks.  But it was unclear if Xarelto was the cause, we tried to use Eliquis instead.  After starting Eliquis, he had "throat congestion", and that worried him.  He was treated with steroids for possible laryngitis, but he was also concerned about possible allergic reaction.  He did not have any tongue swelling or breathing difficulty etc.  As a result, he is currently not on any anticoagulation.  With regards to hyperlipidemia, he has tried 3 different statins in the past, he is unaware of the names.  However, all 3 of them caused severe myalgias.  He tried Nexletol, but reportedly had toe and ankle swelling which was thought to be possibly gout.  Therefore, Nexletol was stopped.  As result, he is not on any lipid-lowering therapy at this time.  Vitals:   07/20/23 0831  BP: 110/70  Pulse: (!) 58  SpO2: 97%     ROS:  Review of Systems  Cardiovascular:  Negative for chest pain, dyspnea on exertion, leg swelling, palpitations and syncope.  Neurological:  Positive for light-headedness.     Studies Reviewed: Marland Kitchen   EKG  Interpretation Date/Time:  Thursday July 20 2023 08:35:03 EDT Ventricular Rate:  58 PR Interval:  176 QRS Duration:  98 QT Interval:  428 QTC Calculation: 420 R Axis:   18  Text Interpretation: EKG 07/20/2023: Sinus rhythm 58 bpm When compared with ECG of 01-Jul-2018 16:37, Vent. rate has decreased BY  29 BPM Confirmed by Zakee Deerman [phone]) on 07/20/2023 8:36:37 AM   EKG 07/20/2023: Sinus rhythm 58 bpm When compared with ECG of 01-Jul-2018 16:37, Vent. rate has decreased BY  29 BPM    Independently interpreted Recent labs: 09/16/2022: Glucose 163, BUN/Cr 16/0.89. EGFR 96. Na/K 140/4.9. Rest of the CMP normal H/H 15/46. MCV 88. Platelets 326 HbA1C 7.0% Chol 180, TG 76, HDL 37, LDL 129   09/10/2022: H/H 15/44. MCV 85. Platelets 299  Echocardiogram 11/16/2022: Left ventricle cavity is normal in size. Normal left ventricular wall thickness. Normal global wall motion. Normal LV systolic function with EF 64%. Normal diastolic filling pattern. Trileaflet aortic valve. Mild to moderate, posteriorly directed aortic regurgitation. Mild MR noted on previous study in 2020, not noted on this study.   Exercise nuclear stress test 11/14/2022: Myocardial perfusion is normal. Overall LV systolic function is normal without regional wall motion abnormalities. Stress LV EF: 51%.  Normal ECG stress. The patient exercised for 9 minutes and 59 seconds of a Bruce protocol, achieving approximately 11.8 METs & 89% MPHR. No chest pain. Stress terminated due to fatigue. The blood pressure response was normal.  No previous exam available for comparison. Low risk.      Mobile cardiac telemetry 13 days 09/21/2022 - 10/05/2022: Dominant rhythm: Sinus. HR 42-145 bpm. Avg HR 63 bpm, in sinus rhythm. Atrial fibrillation/flutter <1% burden, average ventricular rate of 139 bpm.  Longest episode lasted 1 hour 6-minute, average rate 142 bpm.  A-fib/flutter was detected within +/- 45 seconds of symptomatic patient  events.   Stated episodes of SVT are also likely to be A-fib/flutter. <1% isolated SVE, couplet/triplets. 1 episode of VT, at 169 bpm for 6 beats. <1% isolated VE, couplets. No high grade AV block, sinus pause >3sec noted. 28 patient triggered events, correlated with both sinus rhythm and Afib  Risk Assessment/Calculations:    CHA2DS2-VASc Score = 2  This indicates a 2.2% annual risk of stroke. The patient's score is based upon: CHF History: 0 HTN History: 0 Diabetes History: 1 Stroke History: 0 Vascular Disease History: 1 Age Score: 0 Gender Score: 0       Physical Exam:   Physical Exam Vitals and nursing note reviewed.  Constitutional:      General: He is not in acute distress. Neck:     Vascular: No JVD.  Cardiovascular:     Rate and Rhythm: Normal rate and regular rhythm.     Heart sounds: Normal heart sounds. No murmur heard. Pulmonary:     Effort: Pulmonary effort is normal.     Breath sounds: Normal breath sounds. No wheezing or rales.  Musculoskeletal:     Right lower leg: No edema.     Left lower leg: No edema.      VISIT DIAGNOSES:   ICD-10-CM   1. Coronary artery disease involving native coronary artery of native heart without angina pectoris  I25.10 EKG 12-Lead    dabigatran (PRADAXA) 150 MG CAPS capsule    AMB Referral to [facility] Pharm-D    2. PAF (paroxysmal atrial fibrillation) (HCC)  I48.0 EKG 12-Lead    dabigatran (PRADAXA) 150 MG CAPS capsule    3. Mixed hyperlipidemia  E78.2 dabigatran (PRADAXA) 150 MG CAPS capsule    AMB [facility]erral to [facility]       ASSESSMENT AND PLAN: .    Daniel Roberts is a 63 y.o. male with hyperlipidemia, type 2 DM, elevated coronary calcium score, PAF   Paroxysmal Afib: <1% burden, occasionally symptomatic. Continue Multaq 400 mg bid. If recurrent Afib on Multaq, would recommend EP referral and consideration for ablation. CHA2DS2VASc score 2, annual stroke risk 2.2% (CAD, DM) He has not tolerated  Xarelto due to abdomen/groin rash, and Eliquis due to throat congestion.  While I am not entirely sure these were true allergic reactions, reasonable to try an alternate agent such as Pradaxa 150 mg twice daily.  Elevated CA score, CAD without angina: 288 in LAD. No ischemia on stress testing (10/2022). He has tried 3 statins in the past, that caused myalgias. Nexletol causes possible gout. Refer to lipid for consideration for Zetia versus injectable agents.     Lightheadedness: Not on any antihypertensive agents.  Blood pressure normal.  Encourage liberal hydration. It is   Meds ordered this encounter  Medications   dabigatran (PRADAXA) 150 MG CAPS capsule    Sig: Take 1 capsule (150 mg total) by mouth 2 (two) times daily.    Dispense:  60 capsule    Refill:  6   dronedarone (MULTAQ) 400 MG tablet    Sig: Take 1 tablet (400 mg total) by mouth 2 (two) times daily with  a meal.    Dispense:  180 tablet    Refill:  3     F/u in 1 year  Signed, Elder Negus, MD

## 2023-07-20 ENCOUNTER — Encounter: Payer: Self-pay | Admitting: Cardiology

## 2023-07-20 ENCOUNTER — Ambulatory Visit: Payer: 59 | Attending: Cardiology | Admitting: Cardiology

## 2023-07-20 VITALS — BP 110/70 | HR 58 | Ht 73.0 in | Wt 199.4 lb

## 2023-07-20 DIAGNOSIS — E782 Mixed hyperlipidemia: Secondary | ICD-10-CM

## 2023-07-20 DIAGNOSIS — I480 Paroxysmal atrial fibrillation: Secondary | ICD-10-CM | POA: Diagnosis not present

## 2023-07-20 DIAGNOSIS — I2510 Atherosclerotic heart disease of native coronary artery without angina pectoris: Secondary | ICD-10-CM | POA: Diagnosis not present

## 2023-07-20 MED ORDER — MULTAQ 400 MG PO TABS: 400.0000 mg | ORAL_TABLET | Freq: Two times a day (BID) | ORAL | 3 refills | Status: AC

## 2023-07-20 MED ORDER — DABIGATRAN ETEXILATE MESYLATE 150 MG PO CAPS: 150.0000 mg | ORAL_CAPSULE | Freq: Two times a day (BID) | ORAL | 6 refills | Status: DC

## 2023-07-20 NOTE — Patient Instructions (Signed)
 Medication Instructions:   START PRADAXA 150 MG BY MOUTH TWICE DAILY  *If you need a refill on your cardiac medications before your next appointment, please call your pharmacy*   You have been referred to OUR LIPID CLINIC TO SEE THE PHARMACIST HERE IN THE OFFICE    Follow-Up: At [facility], you and your health needs are our priority.  As part of our continuing mission to provide you with exceptional heart care, we have created designated Provider Care Teams.  These Care Teams include your primary Cardiologist (physician) and Advanced Practice Providers (APPs -  Physician Assistants and Nurse Practitioners) who all work together to provide you with the care you need, when you need it.  We recommend signing up for the patient portal called "MyChart".  Sign up information is provided on this After Visit Summary.  MyChart is used to connect with patients for Virtual Visits (Telemedicine).  Patients are able to view lab/test results, encounter notes, upcoming appointments, etc.  Non-urgent messages can be sent to your provider as well.   To learn more about what you can do with MyChart, go to [email or web address].    Your next appointment:   6 month(s)  Provider:   DR Rosemary Holms   Other Instructions    1st Floor: - Lobby - Registration  - Pharmacy  - Lab - Cafe  2nd Floor: - PV Lab - Diagnostic Testing (echo, CT, nuclear med)  3rd Floor: - Vacant  4th Floor: - TCTS (cardiothoracic surgery) - AFib Clinic - Structural Heart Clinic - Vascular Surgery  - Vascular Ultrasound  5th Floor: - HeartCare Cardiology (general and EP) - Clinical Pharmacy for coumadin, hypertension, lipid, weight-loss medications, and med management appointments    Valet parking services will be available as well.

## 2023-09-15 ENCOUNTER — Ambulatory Visit: Admitting: Pharmacist

## 2023-11-03 ENCOUNTER — Ambulatory Visit: Attending: Cardiology | Admitting: Pharmacist

## 2023-11-03 ENCOUNTER — Telehealth: Payer: Self-pay

## 2023-11-03 ENCOUNTER — Other Ambulatory Visit (HOSPITAL_COMMUNITY): Payer: Self-pay

## 2023-11-03 DIAGNOSIS — E782 Mixed hyperlipidemia: Secondary | ICD-10-CM | POA: Diagnosis not present

## 2023-11-03 NOTE — Patient Instructions (Signed)
 I will submit a prior authorization for Repatha. I will call you once I hear back. Please call me at [phone] with any questions.   Repatha is a cholesterol medication that improved your body's ability to get rid of bad cholesterol known as LDL. It can lower your LDL up to 60%! It is an injection that is given under the skin every 2 weeks. The medication often requires a prior authorization from your insurance company. We will take care of submitting all the necessary information to your insurance company to get it approved. The most common side effects of Repatha include runny nose, symptoms of the common cold, rarely flu or flu-like symptoms, back/muscle pain in about 3-4% of the patients, and redness, pain, or bruising at the injection site. Tell your healthcare provider if you have any side effect that bothers you or that does not go away.     Hyperlipidemia Foods high in saturated fat tend to increase LDL (bad) cholesterol the most.  Not all fat is bad fat! Foods higher in unsaturated fat are healthy, like fish, nuts, and avocadoes. Overall, following a diet like the Mediterranean diet can help to improve your cholesterol. Hypertriglyceridemia Foods high in carbohydrates and sugar, as well as alcohol, can increase your triglycerides. If you are diabetic, poorly controlled blood sugar can also increase your triglycerides. A non-fasting state can affect the triglyceride level in your lab work. Please make sure you are fasting to improve accuracy of this lab test.  Eat more of these Eat less of these  Carbohydrates Fiber-rich whole grains: oats, whole wheat pasta or bread, quinoa, barley, oats and brown rice Aim for  of your plate to be whole grains Men: aim for > 38 grams of fiber per day Women: aim for > 25 grams of fiber per day Refined grains: white bread, rice, or pasta, macaroni and cheese Foods with added sugar Processed foods: desserts like cake, cookies, donuts, muffins, and  pastries; microwave meals, chips, [place] fries  Fruits and vegetables A variety of bright colored fruits and vegetables: spinach, broccoli, tomatoes, carrots, berries,  oranges, apples, bananas, berries, and melon Aim for  of your plate to be fruits/vegetables Canned vegetables Starchy vegetables like potatoes Canned fruit in heavy syrup  Protein Lean meat: skinless chicken or [place] Fish: salmon, trout, tuna, cod, tilapia, flounder, etc Legumes: beans, lentils, chickpeas, tofu, nuts Aim for  of your plate to be protein Red, fatty, or fried meat Processed foods: deli meat, hot dogs, burgers, pizza, fast food   Dairy, fats and oils Unsaturated fats: fish, nuts, and avocadoes  Low fat or fat free milk or yogurt Olive and canola oil Saturated fats: butter, lard, cream, coconut oil Whole milk and other full fat dairy products like cheese Sugar-sweetened dairy products (many yogurts have added sugar)  Drinks Water: plain or sparkling Sugar free or diet drinks Unsweet tea or coffee Keep added sugar intake to  < 6 teaspoons (24 grams) Regular soda Fruit juice Alcohol  Other ways to adopt a healthy lifestyle:  Exercise:  Exercise: Aim for 150 min of moderate intensity exercise weekly for heart health, and weights twice weekly for bone health. Stay active - any steps are better than no steps!  Sleep: Aim for 7-9 hours of sleep nightly.  Weight: Know what a healthy weight is for you (roughly BMI <25) and aim to maintain this. Unfortunately, this is not the most accurate measure of healthy weight, but it is the simplest measurement to use. A  more accurate measurement involves body scanning which measures lean muscle, fat tissue and bony density. We do not have this equipment at [facility].

## 2023-11-03 NOTE — Progress Notes (Signed)
 Patient ID: [name]                 DOB: 1961-05-08                    MRN: [id number]      HPI: Daniel Roberts is a 63 y.o. male patient referred to lipid clinic by Dr. Elmira. PMH is significant for hyperlipidemia, type 2 DM, elevated coronary calcium score, PAF.  History of intolerances both rashes and myalgias to various cholesterol medications.  He presents today to lipid clinic.  He is very open to medication options.  He is interested in treating his cholesterol.  He is very active on the weekends walking or hiking several miles on Saturday and Sunday but is not good about getting exercise and during the week.  Reviewed options for lowering LDL cholesterol, including PCSK-9 inhibitors and inclisiran.  Discussed mechanisms of action, dosing, side effects and potential decreases in LDL cholesterol.  Also reviewed cost information and potential options for patient assistance.   Current Medications: none Intolerances: rosuvastatin (rash), simvastatin , Livalo (severe mayalgias) Nexletol (? Gout- toes/ankle swelling), ezetimibe  (rash) Risk Factors: DM, CAC 289 (80th percentile) LDL-C goal: <70 ApoB goal: <80  Diet:  Breakfast: scrambled eggs Lunch: doesn't eat Dinner: yogurt nuts fruit Chicken, [place], pretty big veggie eater Red meat- 2-3 times per week Drink: water, crystal lite, diet mt dew once a week, coffee w/ almond milk  Exercise: hikes 3-4 miles 2 days a week  Family History:  Family History  Problem Relation Age of Onset   Diabetes Mother    Atrial fibrillation Mother    Stroke Father    Diabetes Brother    Healthy Brother     Social History: no tobacco, no ETOH  Labs: Lipid Panel  09/17/23 TC 180, TG 76, HDL 37, LDL-C 129 No results found for: CHOL, TRIG, HDL, CHOLHDL, VLDL, LDLCALC, LDLDIRECT, LABVLDL  Past Medical History:  Diagnosis Date   Atrial fibrillation (HCC)    Diabetes mellitus without complication (HCC)    Dyspnea on  exertion 07/18/2018   Hyperlipidemia    Palpitations 07/18/2018    Current Outpatient Medications on File Prior to Visit  Medication Sig Dispense Refill   dabigatran  (PRADAXA ) 150 MG CAPS capsule Take 1 capsule (150 mg total) by mouth 2 (two) times daily. 60 capsule 6   Ascorbic Acid (VITAMIN C) 500 MG CAPS 1 tablet     Cetirizine HCl (ZYRTEC ALLERGY) 10 MG CAPS Take by mouth.     Cholecalciferol (VITAMIN D-3) 25 MCG (1000 UT) CAPS Take by mouth.     Cinnamon 500 MG capsule Take by mouth daily. Unsure of dose     Continuous Glucose Sensor (FREESTYLE LIBRE 3 SENSOR) MISC AS DIRECTED TO CHECK BLOOD GLUCOSE EVERY 14 DAYS. E11.69 28 DAYS     dronedarone  (MULTAQ ) 400 MG tablet Take 1 tablet (400 mg total) by mouth 2 (two) times daily with a meal. 180 tablet 3   Multiple Vitamin (MULTI-VITAMIN) tablet 1 tablet     Omega-3 Fatty Acids (FISH OIL) 1000 MG CAPS Take by mouth 2 (two) times daily.     Plant Sterols and Stanols (CHOLESTOFF) 450 MG TABS Take by mouth.     psyllium (REGULOID) 0.52 g capsule Take 0.52 g by mouth daily.     sildenafil (VIAGRA) 25 MG tablet 1-2 tablet as needed     valACYclovir (VALTREX) 500 MG tablet as needed.     Zinc 50 MG TABS  Take by mouth.     No current facility-administered medications on file prior to visit.    Allergies  Allergen Reactions   Crestor [Rosuvastatin Calcium]     Severe rash   Lipitor [Atorvastatin Calcium]     Severe myalgia   Livalo [Pitavastatin]     Severe rash   Bee Venom    Ezetimibe      Other reaction(s): rash   Metformin     Other reaction(s): rash    Assessment/Plan:  1. Hyperlipidemia -  Mixed hyperlipidemia Assessment LDL-C is above goal of less than 70 due to diabetes and coronary calcium score Patient has had rash to rosuvastatin, muscle pains to simvastatin  and Livalo and potential gout flare to Nexletol.  He also had rash to ezetimibe  We discussed both injection options including Repatha and Leqvio In my  professional opinion Repatha has more data to support its use and is more likely to be covered by his insurance He hikes several miles on the weekends Injection technique of Repatha reviewed along with cost of potential side effects  Plan Submit prior authorization for Repatha Follow-up labs in 3 months Increase exercise on weekdays-discussed the effects on insulin resistance last about 48 hours    Thank you,  Dervin Vore D Anaiz Qazi, Pharm.JONETTA SARAN, CPP [facility] A Division of [facility] [address], [place], [place] [address]  Phone: [phone]; Fax: [phone]

## 2023-11-03 NOTE — Telephone Encounter (Signed)
-----   Message from Daniel Roberts sent at 11/03/2023 10:44 AM EDT ----- Please submit prior authorization for Repatha

## 2023-11-03 NOTE — Assessment & Plan Note (Signed)
 Assessment LDL-C is above goal of less than 70 due to diabetes and coronary calcium score Patient has had rash to rosuvastatin, muscle pains to simvastatin  and Livalo and potential gout flare to Nexletol.  He also had rash to ezetimibe  We discussed both injection options including Repatha and Leqvio In my professional opinion Repatha has more data to support its use and is more likely to be covered by his insurance He hikes several miles on the weekends Injection technique of Repatha reviewed along with cost of potential side effects  Plan Submit prior authorization for Repatha Follow-up labs in 3 months Increase exercise on weekdays-discussed the effects on insulin resistance last about 48 hours

## 2023-11-03 NOTE — Telephone Encounter (Signed)
 Pharmacy Patient Advocate Encounter  Insurance verification completed.   The patient is insured through [employer] test claim for REPATHA. Currently a quantity of 2 ML is a 28 day supply and the co-pay is $45 . The current 28 day co-pay is, $45.  No PA needed at this time.  This test claim was processed through [facility]- copay amounts may vary at other pharmacies due to pharmacy/plan contracts, or as the patient moves through the different stages of their insurance plan.

## 2023-11-06 MED ORDER — REPATHA SURECLICK 140 MG/ML ~~LOC~~ SOAJ: 1.0000 mL | SUBCUTANEOUS | 11 refills | Status: AC

## 2023-11-06 NOTE — Telephone Encounter (Signed)
 Patient made aware of approval.  Reminded to get a co-pay card for [email or web address].  Patient requested Rx be sent to [facility] in [place] town.  He has an appointment with Dr. Clarice in September so we will have him drawl cholesterol labs.

## 2023-11-06 NOTE — Addendum Note (Signed)
 Addended by: Rykker Coviello D on: 11/06/2023 11:07 AM   Modules accepted: Orders

## 2023-12-07 ENCOUNTER — Encounter: Payer: Self-pay | Admitting: Pharmacist

## 2023-12-07 NOTE — Telephone Encounter (Signed)
 Called and spoke with patient.  He reports that he took his first injection without any issue.  After his second injection he has had several side effects.  He has had runny nose diarrhea and sore throat.  States all of those were short-lived and went away quickly.  But ever since Saturday night he has had significant back pain.  Went and saw his chiropractor who could not find any source of his pain.  Reports pain is not getting any better.  Due for his next injection on Sunday.  Asking if he should skip it.  I advised that he should skip his next injection until his pain resolves and then try again i.e. rechallenge with the medication.  Advised that if his pain does not improve he should see his primary or orthopedic walk-in.  Advised patient to send me a message with an update on how he is doing.

## 2024-02-24 ENCOUNTER — Other Ambulatory Visit: Payer: Self-pay | Admitting: Cardiology

## 2024-02-24 DIAGNOSIS — I480 Paroxysmal atrial fibrillation: Secondary | ICD-10-CM

## 2024-02-24 DIAGNOSIS — I2510 Atherosclerotic heart disease of native coronary artery without angina pectoris: Secondary | ICD-10-CM

## 2024-02-24 DIAGNOSIS — E782 Mixed hyperlipidemia: Secondary | ICD-10-CM

## 2024-02-26 NOTE — Telephone Encounter (Signed)
 Prescription refill request for pradaxa  received.  Indication:afib Last office visit:6/25 Weight:90.4  kg Age:63 Drm:wzzid labs CrCl: Prescription refilled

## 2024-04-04 ENCOUNTER — Other Ambulatory Visit: Payer: Self-pay | Admitting: Cardiology

## 2024-04-04 DIAGNOSIS — E782 Mixed hyperlipidemia: Secondary | ICD-10-CM

## 2024-04-04 DIAGNOSIS — I2510 Atherosclerotic heart disease of native coronary artery without angina pectoris: Secondary | ICD-10-CM

## 2024-04-04 DIAGNOSIS — I480 Paroxysmal atrial fibrillation: Secondary | ICD-10-CM

## 2024-04-09 NOTE — Telephone Encounter (Signed)
 Prescription refill request for pradaxa  received.  Indication:afib Last office visit:6/25 Weight:90.4  kg Age:63 Drm:wzzid labs CrCl: Prescription refilled

## 2024-08-06 ENCOUNTER — Ambulatory Visit: Admitting: Cardiology
